# Patient Record
Sex: Male | Born: 1990 | Race: Black or African American | Hispanic: No | Marital: Married | Smoking: Never smoker
Health system: Southern US, Community
[De-identification: ages and names within clinical notes are randomized; demographics above are authoritative.]

## PROBLEM LIST (undated history)

## (undated) DIAGNOSIS — B009 Herpesviral infection, unspecified: Secondary | ICD-10-CM

## (undated) HISTORY — DX: Herpesviral infection, unspecified: B00.9

---

## 2013-12-11 ENCOUNTER — Ambulatory Visit: Payer: Self-pay | Admitting: Adult Health

## 2014-01-01 ENCOUNTER — Ambulatory Visit: Payer: Self-pay | Admitting: Adult Health

## 2014-01-25 ENCOUNTER — Emergency Department: Payer: Self-pay | Admitting: Emergency Medicine

## 2014-01-25 LAB — URINALYSIS, COMPLETE
Bacteria: NONE SEEN
GLUCOSE, UR: NEGATIVE mg/dL (ref 0–75)
Ketone: NEGATIVE
Leukocyte Esterase: NEGATIVE
Nitrite: NEGATIVE
Ph: 8 (ref 4.5–8.0)
Protein: NEGATIVE
RBC,UR: 1 /HPF (ref 0–5)
Specific Gravity: 1.017 (ref 1.003–1.030)
Squamous Epithelial: NONE SEEN

## 2014-01-25 LAB — DRUG SCREEN, URINE
AMPHETAMINES, UR SCREEN: NEGATIVE (ref ?–1000)
Barbiturates, Ur Screen: NEGATIVE (ref ?–200)
Benzodiazepine, Ur Scrn: NEGATIVE (ref ?–200)
Cannabinoid 50 Ng, Ur ~~LOC~~: NEGATIVE (ref ?–50)
Cocaine Metabolite,Ur ~~LOC~~: NEGATIVE (ref ?–300)
MDMA (ECSTASY) UR SCREEN: NEGATIVE (ref ?–500)
Methadone, Ur Screen: NEGATIVE (ref ?–300)
OPIATE, UR SCREEN: NEGATIVE (ref ?–300)
PHENCYCLIDINE (PCP) UR S: NEGATIVE (ref ?–25)
Tricyclic, Ur Screen: NEGATIVE (ref ?–1000)

## 2014-01-25 LAB — COMPREHENSIVE METABOLIC PANEL
ALK PHOS: 92 U/L
AST: 21 U/L (ref 15–37)
Albumin: 4.4 g/dL (ref 3.4–5.0)
Anion Gap: 8 (ref 7–16)
BUN: 15 mg/dL (ref 7–18)
Bilirubin,Total: 0.7 mg/dL (ref 0.2–1.0)
Calcium, Total: 9.7 mg/dL (ref 8.5–10.1)
Chloride: 103 mmol/L (ref 98–107)
Co2: 26 mmol/L (ref 21–32)
Creatinine: 0.97 mg/dL (ref 0.60–1.30)
EGFR (African American): >60
Glucose: 82 mg/dL (ref 65–99)
Osmolality: 274 (ref 275–301)
Potassium: 3.5 mmol/L (ref 3.5–5.1)
SGPT (ALT): 25 U/L (ref 12–78)
Sodium: 137 mmol/L (ref 136–145)
TOTAL PROTEIN: 8.5 g/dL — AB (ref 6.4–8.2)

## 2014-01-25 LAB — ACETAMINOPHEN LEVEL: Acetaminophen: <2 ug/mL

## 2014-01-25 LAB — CBC
HCT: 45.8 % (ref 40.0–52.0)
HGB: 14.9 g/dL (ref 13.0–18.0)
MCH: 27.8 pg (ref 26.0–34.0)
MCHC: 32.4 g/dL (ref 32.0–36.0)
MCV: 86 fL (ref 80–100)
PLATELETS: 166 10*3/uL (ref 150–440)
RBC: 5.34 10*6/uL (ref 4.40–5.90)
RDW: 13.3 % (ref 11.5–14.5)
WBC: 7.1 10*3/uL (ref 3.8–10.6)

## 2014-01-25 LAB — SALICYLATE LEVEL

## 2014-01-25 LAB — ETHANOL: Ethanol %: <0.003 % (ref 0.000–0.080)

## 2014-01-25 LAB — TROPONIN I: Troponin-I: <0.02 ng/mL

## 2014-02-17 ENCOUNTER — Emergency Department (HOSPITAL_BASED_OUTPATIENT_CLINIC_OR_DEPARTMENT_OTHER)
Admission: EM | Admit: 2014-02-17 | Discharge: 2014-02-17 | Discharge status: Against Medical Advice | Payer: BC Managed Care – PPO | Attending: Emergency Medicine | Admitting: Emergency Medicine

## 2014-02-17 ENCOUNTER — Emergency Department (HOSPITAL_BASED_OUTPATIENT_CLINIC_OR_DEPARTMENT_OTHER): Payer: BC Managed Care – PPO

## 2014-02-17 DIAGNOSIS — R0602 Shortness of breath: Secondary | ICD-10-CM | POA: Insufficient documentation

## 2014-02-17 DIAGNOSIS — F411 Generalized anxiety disorder: Secondary | ICD-10-CM | POA: Insufficient documentation

## 2014-02-17 DIAGNOSIS — F419 Anxiety disorder, unspecified: Secondary | ICD-10-CM

## 2014-02-17 DIAGNOSIS — R002 Palpitations: Secondary | ICD-10-CM | POA: Insufficient documentation

## 2014-02-17 MED ORDER — LORAZEPAM 1 MG PO TABS
2.0000 mg | ORAL_TABLET | Freq: Once | ORAL | Status: AC
  Administered 2014-02-17: 2 mg via ORAL
  Filled 2014-02-17: qty 2

## 2014-02-17 NOTE — ED Notes (Signed)
Declines blood test and EKG.  States he was seen at [facility] 1 month ago with same symptoms, had a work up and it was all negative.

## 2014-02-17 NOTE — ED Notes (Signed)
Per EMS:  Pt having personal issues with family and friends.  Pt having anxiety, increased respiratory rate, c/o tingling in arms and legs.  No acute respiratory distress noted.

## 2014-02-17 NOTE — ED Notes (Signed)
Pt on telephone trying to get a ride.

## 2014-02-17 NOTE — ED Provider Notes (Signed)
CSN: [id number]     Arrival date & time 02/17/14  1042 History   First MD Initiated Contact with Patient 02/17/14 1103     Chief Complaint  Patient presents with  . Anxiety     (Consider location/radiation/quality/duration/timing/severity/associated sxs/prior Treatment) Patient is a 23 y.o. male presenting with anxiety. The history is provided by the patient and the EMS personnel.  Anxiety Associated symptoms include chest pain and shortness of breath. Pertinent negatives include no abdominal pain.   patient brought in by EMS from work. Patient stating having anxiety attack. Patient noted increased respiration and tingling in his arms and legs felt as if he had heart palpitations felt short of breath was having substernal chest pain. Patient does state this has happened before but not recently. Patient denies any suicidal or homicidal ideation. Patient doesn't at the does been some family problems but doesn't want to elaborate on what they are. Patient feeling better here but still having chest discomfort and feeling like his palpitations. Cardiac monitor in route showed no significant arrhythmia.  No past medical history on file. No past surgical history on file. No family history on file. History  Substance Use Topics  . Smoking status: Not on file  . Smokeless tobacco: Not on file  . Alcohol Use: Not on file    Review of Systems  Constitutional: Negative for fever.  Eyes: Negative for visual disturbance.  Respiratory: Positive for shortness of breath.   Cardiovascular: Positive for chest pain and palpitations.  Gastrointestinal: Negative for nausea, vomiting and abdominal pain.  Genitourinary: Negative for dysuria.  Musculoskeletal: Negative for back pain.  Skin: Negative for rash.  Psychiatric/Behavioral: Negative for suicidal ideas, confusion and self-injury.      Allergies  Review of patient's allergies indicates no known allergies.  Home Medications   Prior to  Admission medications   Not on File   BP 132/82  Pulse 66  Temp(Src) 98.1 F (36.7 C) (Oral)  Resp 24  Ht 6' (1.829 m)  Wt 170 lb (77.111 kg)  BMI 23.05 kg/m2  SpO2 100% Physical Exam  Nursing note and vitals reviewed. Constitutional: He is oriented to person, place, and time. He appears well-developed and well-nourished. No distress.  HENT:  Head: Normocephalic and atraumatic.  Mouth/Throat: Oropharynx is clear and moist.  Eyes: Conjunctivae and EOM are normal. Pupils are equal, round, and reactive to light.  Neck: Normal range of motion.  Cardiovascular: Normal rate, regular rhythm and normal heart sounds.   No murmur heard. Pulmonary/Chest: [place] normal and breath sounds normal.  Abdominal: Soft. Bowel sounds are normal. There is no tenderness.  Musculoskeletal: Normal range of motion.  Neurological: He is alert and oriented to person, place, and time. No cranial nerve deficit. He exhibits normal muscle tone. Coordination normal.  Skin: Skin is warm.  Psychiatric:  Anxious appearing.    ED Course  Procedures (including critical care time) Labs Review Labs Reviewed  BASIC METABOLIC PANEL  CBC WITH DIFFERENTIAL    Imaging Review No results found.   EKG Interpretation None      MDM   Final diagnoses:  Anxiety    Patient after receiving oral Ativan refuses any additional workup chest x-ray EKG blood work. Patient understands that due to his complaint of substernal chest pain and palpitations at some workup is required either still refuses realizes that could be a significant finding that could be potentially harmful to his health or death. Patient understands and will leave AMA.    Emory Gallentine  Deretha EmoryZackowski, MD 02/17/14 1213

## 2014-02-17 NOTE — ED Notes (Signed)
Dr. Deretha EmoryZackowski at bedside and discussing ordered diagnostics and labs.  Pt still refuses and wants to leave AMA.  EDP discussed risks of leaving AMA.

## 2014-02-17 NOTE — Discharge Instructions (Signed)
Panic Attacks °Panic attacks are sudden, short feelings of great fear or discomfort. You may have them for no reason when you are relaxed, when you are uneasy (anxious), or when you are sleeping.  °HOME CARE °· Take all your medicines as told. °· Check with your doctor before starting new medicines. °· Keep all doctor visits. °GET HELP IF: °· You are not able to take your medicines as told. °· Your symptoms do not get better. °· Your symptoms get worse. °GET HELP RIGHT AWAY IF: °· Your attacks seem different than your normal attacks. °· You have thoughts about hurting yourself or others. °· You take panic attack medicine and you have a side effect. °MAKE SURE YOU: °· Understand these instructions. °· Will watch your condition. °· Will get help right away if you are not doing well or get worse. °Document Released: 08/26/2010 Document Revised: 05/14/2013 Document Reviewed: 03/07/2013 °ExitCare® Patient Information ©2015 [employer]. This information is not intended to replace advice given to you by your health care provider. Make sure you discuss any questions you have with your health care provider. ° ° °Emergency Department Resource Guide °1) Find a Doctor and Pay Out of Pocket °Although you won't have to find out who is covered by your insurance plan, it is a good idea to ask around and get recommendations. You will then need to call the office and see if the doctor you have chosen will accept you as a new patient and what types of options they offer for patients who are self-pay. Some doctors offer discounts or will set up payment plans for their patients who do not have insurance, but you will need to ask so you aren't surprised when you get to your appointment. ° °2) Contact Your Local Health Department °Not all health departments have doctors that can see patients for sick visits, but many do, so it is worth a call to see if yours does. If you don't know where your local health department is, you can check in  your phone book. The CDC also has a tool to help you locate your state's health department, and many state websites also have listings of all of their local health departments. ° °3) Find a Walk-in Clinic °If your illness is not likely to be very severe or complicated, you may want to try a walk in clinic. These are popping up all over the country in pharmacies, drugstores, and shopping centers. They're usually staffed by nurse practitioners or physician assistants that have been trained to treat common illnesses and complaints. They're usually fairly quick and inexpensive. However, if you have serious medical issues or chronic medical problems, these are probably not your best option. ° °No Primary Care Doctor: °- Call Health Connect at  832-8000 - they can help you locate a primary care doctor that  accepts your insurance, provides certain services, etc. °- Physician Referral Service- 1-800-533-3463 ° °Chronic Pain Problems: °Organization         Address  Phone   Notes  °[facility]  (336) 297-2271 Patients need to be referred by their primary care doctor.  ° °Medication Assistance: °Organization         Address  Phone   Notes  °[place] [facility] [address] °[place], [place] [address] (336) 641-8030 --Must be a resident of [place] °-- Must have NO insurance coverage whatsoever (no Medicaid/ Medicare, etc.) °-- The pt. MUST have a primary care doctor   that directs their care regularly and follows them in the community °  °[facility]  (866) 331-1348   °United Way  (888) 892-1162   ° °Agencies that provide inexpensive medical care: °Organization         Address  Phone   Notes  °[facility]  (336) 832-8035   °[facility]    (336) 832-7272   °[facility] [address] °[place], [place] [address] (336) 832-4777   °[facility] [address], °[place] (336) 271-4999    °[facility]    (336) 373-0678   °[facility]    (336) 272-1050   °[facility] ° [address], [place] Phone:  (336) 832-4444, Fax:  (336) 832-4440 Hours of Operation:  9 am - 6 pm, M-F.  Also accepts Medicaid/Medicare and self-pay.  °[facility] ° [address], [place] Phone: (336) 832-3150, Fax: (336) 832-3151. Hours of Operation:  8:30 am - 5:30 pm, M-F.  Also accepts Medicaid and self-pay.  °[facility] [place] [address], [place] Phone: (336) 878-6027   °[facility] [address], [place], [place] (336)723-1848, Ext. 123 Mondays & Thursdays: 7-9 AM.  First 15 patients are seen on a first come, first serve basis. °  ° °Medicaid-accepting [place] Providers: ° °Organization         Address  Phone   Notes  °[facility] [address], [place] (336) 641-2100 Also accepts self-pay patients.  °[facility] [address], [place] ° (336) 856-9996   °[facility] [address], [place] (336) 288-8857   °[facility] [address] [place] [address], [place] (336) 299-7000   °Veita Bland [address], [place]  ° (336) 373-1557 Only accepts [place] Access Medicaid patients after they have their name applied to their card.  ° °Self-Pay (no insurance) in [place]: ° °Organization         Address  Phone   Notes  °Sickle Cell Patients, [facility] [address], [place] (336) 832-1970   °[facility] [address], [place] (336) 832-4400   °[facility] ° [address], [place] (336) 992-4800   °[facility]/Dr. Osei-Bonsu ° [address] [place] [address], [place] or [address], [place] (336) 841-8500 Phone number for both [place] and [place] locations is the  same.  °[facility] [address], [place] (336) 299-0000   °[facility] [address] [place] [address], [place] or [address] (336) 852-7530 °(336) 878-2260   °[facility] [address], [place] (336) 350-1642, phone; (336) 294-5005, fax Sees patients 1st and 3rd Saturday of every month.  Must not qualify for public or private insurance (i.e. Medicaid, Medicare, [place] Health Choice, Veterans' Benefits) • Household income should be no more than 200% of the poverty level •The clinic cannot treat you if you are pregnant or think you are pregnant • Sexually transmitted diseases are not treated at the clinic.  ° ° °Dental Care: °Organization         Address  Phone  Notes  °[place] [facility] [address], [place] (336) 641-6152 Accepts children   up to age 21 who are enrolled in Medicaid or [place] Health Choice; pregnant women with a Medicaid card; and children who have applied for Medicaid or [place] Health Choice, but were declined, whose parents can pay a reduced fee at time of service.  °[facility] [place]  [address], [place] (336) 641-7733 Accepts children up to age 21 who are enrolled in Medicaid or [place] Health Choice; pregnant women with a Medicaid card; and children who have applied for Medicaid or [place] Health Choice, but were declined, whose parents can pay a reduced fee at time of service.  °[facility] ° [address], [place] (336) 641-4533 Patients are seen by appointment only. Walk-ins are not accepted. [facility] will see patients 18 years of age and older. °Monday - Tuesday (8am-5pm) °Most Wednesdays (8:30-5pm) °$30 per visit, cash only  °[facility] ° [address], [place] (336) 641-4533 Patients are seen by appointment only. Walk-ins are not accepted. [facility] will see patients  18 years of age and older. °One Wednesday Evening (Monthly: Volunteer Based).  $30 per visit, cash only  °[facility]  (919) 537-3737 for adults; Children under age 4, call Graduate Pediatric Dentistry at (919) [phone]. Children aged 4-14, please call (919) [phone] to request a pediatric application. ° Dental services are provided in all areas of dental care including fillings, crowns and bridges, complete and partial dentures, implants, gum treatment, root canals, and extractions. Preventive care is also provided. Treatment is provided to both adults and children. °Patients are selected via a lottery and there is often a waiting list. °  °[facility] [address], °[place] ° (336) 763-8833 [email or web address] °  °[facility] [address], [place], [place] (336)723-1848, Ext. 123 Second and Fourth Thursday of each month, opens at 6:30 AM; Clinic ends at 9 AM.  Patients are seen on a first-come first-served basis, and a limited number are seen during each clinic.  ° °[facility] ° [address], [place], [place] (336) 723-7904   Eligibility Requirements °You must have lived in [place], [place], or [place] counties for at least the last three months. °  You cannot be eligible for state or federal sponsored healthcare insurance, including Veterans Administration, Medicaid, or Medicare. °  You generally cannot be eligible for healthcare insurance through your employer.  °  How to apply: °Eligibility screenings are held every Tuesday and Wednesday afternoon from 1:00 pm until 4:00 pm. You do not need an appointment for the interview!  °[facility] [address], [place], [place] 336-631-2330   °[facility]  336-342-8273   °[facility]  336-703-3100   °[facility]  336-570-6415   ° °Behavioral Health Resources in the Community: °Intensive Outpatient  Programs °Organization         Address  Phone  Notes  °[facility] [address], [place], [place] 336-878-6098   °[facility] [address], [place], [place] 336-832-9800   °[facility] [address], [place],  ° 336-882-2125   °[facility] [address],  °[place],  1-800-853-5163 or 336-641-4981   °Substance Abuse Resources °Organization         Address  Phone  Notes  °[facility]  336-882-2125   °Addiction   [facility]  438-084-1214   [facility]  947-242-1690   Floydene Flock  602-119-0424   Residential & Outpatient Substance Abuse Program  (346)689-3845   Psychological Services Organization         Address  Phone  Notes  [facility]  336226-659-9012   [facility]  (682)425-3906   [facility] [address] [place] 940-617-0812 or 330-183-4206    Mobile Crisis Teams Organization         Address  Phone  Notes  [facility], Mobile Crisis Care Unit  (314)581-6737   [facility]  [address] [place], [place] 623-762-8315   Doristine Locks [address] [place] 176-160-7371    Self-Help/Support Groups Organization         Address  Phone             Notes  [facility] [place] - variety of support groups  336- I7437963 Call for more information  Narcotics Anonymous (NA), [facility] [address], [employer] [place]  2 meetings at this location   Statistician         Address  Phone  Notes  [facility] [address],    [place]  0-626-948-5462   [facility]  [address] [place] [address], [place], [place] [phone]   [facility] [address] [place], [place] 169-678-9381 Admissions: 8am-3pm M-F  [facility] [address],    [place], [place]  017-510-2585   [facility] [address] [place], [place], [place] 277-824-2353   [facility] [address],  [place], [place] 614-431-5400   [facility] Intensive Outpatient [address], [place], [place] 867-619-5093   [facility] ([facility] [address] [place]  [place], [place] 2-671-245-8099 or 954-550-8630   [facility] [address] [place], [place] 767-341-9379 Accepts Medicaid  [facility] [address]  [place] 0-240-973-5329 Substance Abuse/Addiction Treatment   [facility] Organization         Address  Phone  Notes  [facility]  862-471-0794   Angie Fava, PhD [address] [place], [place]   (336)334-0025 or 209-073-2439   [facility]   [address] [place], [place] 651-628-0426   [facility] [address] [place], [place] 413-312-0984 Insurance/Medicaid/sponsorship through [facility] [address]                                    [place], [place] [phone] Therapy/tele-psych/case  [facility] [address] [place], [place] (814)[phone]    Dr. Lolly Mustache  4046870614   [facility]  [employer] [facility]. 1) [address] [place] 2) [address] [place] 3)  [address], [place] [phone]  [phone]   [facility] Child Abuse Hotline 407-655-2602 or 262 377 1704 (After Hours)      Patient insisting on leaving AMA. The patient does not want to stay for minimal workup for the chest discomfort and palpitation sensation in his chest. Patient understands potential risk we have ruled out arrhythmia we have that ruled out  any acute lung problems like pneumothorax. Patient understands and a cyst on leaving. Patient will sign out I AMA and knows that he can return at any time.

## 2015-07-28 ENCOUNTER — Ambulatory Visit (INDEPENDENT_AMBULATORY_CARE_PROVIDER_SITE_OTHER): Payer: BLUE CROSS/BLUE SHIELD | Admitting: Physician Assistant

## 2015-07-28 ENCOUNTER — Encounter: Payer: Self-pay | Admitting: Physician Assistant

## 2015-07-28 VITALS — BP 100/80 | HR 66 | Temp 98.7°F | Resp 16 | Ht 72.0 in | Wt 199.4 lb

## 2015-07-28 DIAGNOSIS — B009 Herpesviral infection, unspecified: Secondary | ICD-10-CM

## 2015-07-28 DIAGNOSIS — F329 Major depressive disorder, single episode, unspecified: Secondary | ICD-10-CM

## 2015-07-28 DIAGNOSIS — F418 Other specified anxiety disorders: Secondary | ICD-10-CM | POA: Insufficient documentation

## 2015-07-28 DIAGNOSIS — F32A Depression, unspecified: Secondary | ICD-10-CM

## 2015-07-28 MED ORDER — CITALOPRAM HYDROBROMIDE 10 MG PO TABS: 10.0000 mg | ORAL_TABLET | Freq: Every day | ORAL | Status: DC

## 2015-07-28 MED ORDER — VALACYCLOVIR HCL 1 G PO TABS: 1000.0000 mg | ORAL_TABLET | Freq: Every day | ORAL | Status: DC

## 2015-07-28 NOTE — Patient Instructions (Signed)
Major Depressive Disorder Major depressive disorder is a mental illness. It also may be called clinical depression or unipolar depression. Major depressive disorder usually causes feelings of sadness, hopelessness, or helplessness. Some people with this disorder do not feel particularly sad but lose interest in doing things they used to enjoy (anhedonia). Major depressive disorder also can cause physical symptoms. It can interfere with work, school, relationships, and other normal everyday activities. The disorder varies in severity but is longer lasting and more serious than the sadness we all feel from time to time in our lives. Major depressive disorder often is triggered by stressful life events or major life changes. Examples of these triggers include divorce, loss of your job or home, a move, and the death of a family member or close friend. Sometimes this disorder occurs for no obvious reason at all. People who have family members with major depressive disorder or bipolar disorder are at higher risk for developing this disorder, with or without life stressors. Major depressive disorder can occur at any age. It may occur just once in your life (single episode major depressive disorder). It may occur multiple times (recurrent major depressive disorder). SYMPTOMS People with major depressive disorder have either anhedonia or depressed mood on nearly a daily basis for at least 2 weeks or longer. Symptoms of depressed mood include:  Feelings of sadness (blue or down in the dumps) or emptiness.  Feelings of hopelessness or helplessness.  Tearfulness or episodes of crying (may be observed by others).  Irritability (children and adolescents). In addition to depressed mood or anhedonia or both, people with this disorder have at least four of the following symptoms:  Difficulty sleeping or sleeping too much.   Significant change (increase or decrease) in appetite or weight.   Lack of energy or  motivation.  Feelings of guilt and worthlessness.   Difficulty concentrating, remembering, or making decisions.  Unusually slow movement (psychomotor retardation) or restlessness (as observed by others).   Recurrent wishes for death, recurrent thoughts of self-harm (suicide), or a suicide attempt. People with major depressive disorder commonly have persistent negative thoughts about themselves, other people, and the world. People with severe major depressive disorder may experiencedistorted beliefs or perceptions about the world (psychotic delusions). They also may see or hear things that are not real (psychotic hallucinations). DIAGNOSIS Major depressive disorder is diagnosed through an assessment by your health care provider. Your health care provider will ask aboutaspects of your daily life, such as mood,sleep, and appetite, to see if you have the diagnostic symptoms of major depressive disorder. Your health care provider may ask about your medical history and use of alcohol or drugs, including prescription medicines. Your health care provider also may do a physical exam and blood work. This is because certain medical conditions and the use of certain substances can cause major depressive disorder-like symptoms (secondary depression). Your health care provider also may refer you to a mental health specialist for further evaluation and treatment. TREATMENT It is important to recognize the symptoms of major depressive disorder and seek treatment. The following treatments can be prescribed for this disorder:   Medicine. Antidepressant medicines usually are prescribed. Antidepressant medicines are thought to correct chemical imbalances in the brain that are commonly associated with major depressive disorder. Other types of medicine may be added if the symptoms do not respond to antidepressant medicines alone or if psychotic delusions or hallucinations occur.  Talk therapy. Talk therapy can be  helpful in treating major depressive disorder by providing   support, education, and guidance. Certain types of talk therapy also can help with negative thinking (cognitive behavioral therapy) and with relationship issues that trigger this disorder (interpersonal therapy). A mental health specialist can help determine which treatment is best for you. Most people with major depressive disorder do well with a combination of medicine and talk therapy. Treatments involving electrical stimulation of the brain can be used in situations with extremely severe symptoms or when medicine and talk therapy do not work over time. These treatments include electroconvulsive therapy, transcranial magnetic stimulation, and vagal nerve stimulation.   This information is not intended to replace advice given to you by your health care provider. Make sure you discuss any questions you have with your health care provider.   Document Released: 11/18/2012 Document Revised: 08/14/2014 Document Reviewed: 11/18/2012 Elsevier Interactive Patient Education 2016 Elsevier Inc.  Citalopram tablets What is this medicine? CITALOPRAM (sye TAL oh pram) is a medicine for depression. This medicine may be used for other purposes; ask your health care provider or pharmacist if you have questions. What should I tell my health care provider before I take this medicine? They need to know if you have any of these conditions: -bipolar disorder or a family history of bipolar disorder -diabetes -glaucoma -heart disease -history of irregular heartbeat -kidney or liver disease -low levels of magnesium or potassium in the blood -receiving electroconvulsive therapy -seizures (convulsions) -suicidal thoughts or a previous suicide attempt -an unusual or allergic reaction to citalopram, escitalopram, other medicines, foods, dyes, or preservatives -pregnant or trying to become pregnant -breast-feeding How should I use this medicine? Take this  medicine by mouth with a glass of water. Follow the directions on the prescription label. You can take it with or without food. Take your medicine at regular intervals. Do not take your medicine more often than directed. Do not stop taking this medicine suddenly except upon the advice of your doctor. Stopping this medicine too quickly may cause serious side effects or your condition may worsen. A special MedGuide will be given to you by the pharmacist with each prescription and refill. Be sure to read this information carefully each time. Talk to your pediatrician regarding the use of this medicine in children. Special care may be needed. Patients over 25 years old may have a stronger reaction and need a smaller dose. Overdosage: If you think you have taken too much of this medicine contact a poison control center or emergency room at once. NOTE: This medicine is only for you. Do not share this medicine with others. What if I miss a dose? If you miss a dose, take it as soon as you can. If it is almost time for your next dose, take only that dose. Do not take double or extra doses. What may interact with this medicine? Do not take this medicine with any of the following medications: -certain medicines for fungal infections like fluconazole, itraconazole, ketoconazole, posaconazole, voriconazole -cisapride -dofetilide -dronedarone -escitalopram -linezolid -MAOIs like Carbex, Eldepryl, Marplan, Nardil, and Parnate -methylene blue (injected into a vein) -pimozide -thioridazine -ziprasidone This medicine may also interact with the following medications: -alcohol -aspirin and aspirin-like medicines -carbamazepine -certain medicines for depression, anxiety, or psychotic disturbances -certain medicines for infections like chloroquine, clarithromycin, erythromycin, furazolidone, isoniazid, pentamidine -certain medicines for migraine headaches like almotriptan, eletriptan, frovatriptan, naratriptan,  rizatriptan, sumatriptan, zolmitriptan -certain medicines for sleep -certain medicines that treat or prevent blood clots like dalteparin, enoxaparin, warfarin -cimetidine -diuretics -fentanyl -lithium -methadone -metoprolol -NSAIDs, medicines for pain and  inflammation, like ibuprofen or naproxen -omeprazole -other medicines that prolong the QT interval (cause an abnormal heart rhythm) -procarbazine -rasagiline -supplements like St. John's wort, kava kava, valerian -tramadol -tryptophan This list may not describe all possible interactions. Give your health care provider a list of all the medicines, herbs, non-prescription drugs, or dietary supplements you use. Also tell them if you smoke, drink alcohol, or use illegal drugs. Some items may interact with your medicine. What should I watch for while using this medicine? Tell your doctor if your symptoms do not get better or if they get worse. Visit your doctor or health care professional for regular checks on your progress. Because it may take several weeks to see the full effects of this medicine, it is important to continue your treatment as prescribed by your doctor. Patients and their families should watch out for new or worsening thoughts of suicide or depression. Also watch out for sudden changes in feelings such as feeling anxious, agitated, panicky, irritable, hostile, aggressive, impulsive, severely restless, overly excited and hyperactive, or not being able to sleep. If this happens, especially at the beginning of treatment or after a change in dose, call your health care professional. Bonita Quin may get drowsy or dizzy. Do not drive, use machinery, or do anything that needs mental alertness until you know how this medicine affects you. Do not stand or sit up quickly, especially if you are an older patient. This reduces the risk of dizzy or fainting spells. Alcohol may interfere with the effect of this medicine. Avoid alcoholic drinks. Your  mouth may get dry. Chewing sugarless gum or sucking hard candy, and drinking plenty of water will help. Contact your doctor if the problem does not go away or is severe. What side effects may I notice from receiving this medicine? Side effects that you should report to your doctor or health care professional as soon as possible: -allergic reactions like skin rash, itching or hives, swelling of the face, lips, or tongue -chest pain -confusion -dizziness -fast, irregular heartbeat -fast talking and excited feelings or actions that are out of control -feeling faint or lightheaded, falls -hallucination, loss of contact with reality -seizures -shortness of breath -suicidal thoughts or other mood changes -unusual bleeding or bruising Side effects that usually do not require medical attention (report to your doctor or health care professional if they continue or are bothersome): -blurred vision -change in appetite -change in sex drive or performance -headache -increased sweating -nausea -trouble sleeping This list may not describe all possible side effects. Call your doctor for medical advice about side effects. You may report side effects to FDA at 1-800-FDA-1088. Where should I keep my medicine? Keep out of reach of children. Store at room temperature between 15 and 30 degrees C (59 and 86 degrees F). Throw away any unused medicine after the expiration date. NOTE: This sheet is a summary. It may not cover all possible information. If you have questions about this medicine, talk to your doctor, pharmacist, or health care provider.    2016, Elsevier/Gold Standard. (2013-02-14 13:19:48)

## 2015-07-28 NOTE — Progress Notes (Signed)
Patient: Jason LawsDeonta A Tutson, Male    DOB: 12/31/1990, 24 y.o.   MRN: [id number] Visit Date: 07/28/2015  Today's Provider: Margaretann LovelessJennifer M Burnette, PA-C   Chief Complaint  Patient presents with  . Establish Care  . Depression  . Exposure to STD   Subjective:    Establish Care:  Jason Peterson is a 24 y.o. male who presents today to establish care and voice concerns about being depressed.  He feels well. He reports not exercising. He reports he is sleeping well.   Depression: Patient complains of depression. He complains of hopelessness, depressed mood, anxious and chest pain, patient states it comes with the depressed mood . 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  He denies current suicidal and homicidal plan or intent.   Family history significant for no psychiatric illness.Possible organic causes contributing are: none.  Risk factors: none No previous treatment. Per patient wants more STD testing done. Per patient wife was diagnosed with herpes and is currently on medication.  Sexually Transmitted Disease Check: Patient presents for sexually transmitted disease check. Sexual history reviewed with the patient. STD exposure: sexual contact 1  with individual thought to have HSV (wife).  Previous history of STD:  HPV. Current symptoms include sometimes has a rash specially when patient doesn't take the medication.  Contraception: none.  He states that at the time of diagnosis of HSV him and his wife have been separated just prior. They have both went through counseling and are back together now and he does state that their relationship is in a good place at this time. He states that he was diagnosed with HPV approximately one year ago but has been asymptomatic. Following the separation in June him and his wife both had an outbreak of HSV and both were treated with Valtrex. They both responded well to Valtrex. He states that he has continued to  take Valtrex as he gets recurrent outbreaks when he is not on the medication. [facility] There is currently no outbreak.  He and his wife do have 3 children: 2 girls, aged 894 and 1, and 1 boy, age 64. He states the children are healthy and he feels his family life is a good spot for him right now. He is also taking online classes through [employer] online. He states he is in between jobs right now and feels that this plus school as well as stress with pain pills is what lead him to have increase irritability and depression symptoms.  Review of Systems  Constitutional: Positive for appetite change (not eating much due to depression). Negative for fever, chills, diaphoresis, fatigue and unexpected weight change.  HENT: Negative.   Eyes: Negative.   Respiratory: Positive for chest tightness. Negative for cough, shortness of breath and wheezing.   Cardiovascular: Positive for chest pain. Negative for palpitations and leg swelling.  Gastrointestinal: Negative.   Endocrine: Negative.   Genitourinary: Negative.   Musculoskeletal: Negative.   Skin: Negative.   Allergic/Immunologic: Negative.   Neurological: Negative.   Hematological: Negative.   Psychiatric/Behavioral: Positive for dysphoric mood and agitation. Negative for suicidal ideas, sleep disturbance and self-injury. The patient is nervous/anxious.     Social History      He  reports that he has never smoked. He has never used smokeless tobacco. He reports that he does not drink alcohol or use illicit drugs.  Social History   Social History  . Marital Status: Married    Spouse Name: N/A  . Number of Children: N/A  . Years of Education: N/A   Social History Main Topics  . Smoking status: Never Smoker   . Smokeless tobacco: Never Used  . Alcohol Use: No  . Drug Use: No  . Sexual Activity: Not Asked   Other Topics Concern  . None   Social History Narrative  . None     Patient Active Problem List   Diagnosis Date Noted  . Depression 07/28/2015    History reviewed. No pertinent past surgical history.  Family History        Family Status  Relation Status Death Age  . Mother Alive   . Father Alive         His family history is not on file.    No Known Allergies  Previous Medications   VALACYCLOVIR HCL (VALTREX PO)    Take by mouth daily.    Patient Care Team: Margaretann Loveless, PA-C as PCP - General (Family Medicine) No Pcp Per Patient (General Practice)     Objective:   Vitals: BP 100/80 mmHg  Pulse 66  Temp(Src) 98.7 F (37.1 C) (Oral)  Resp 16  Ht 6' (1.829 m)  Wt 199 lb 6.4 oz (90.447 kg)  BMI 27.04 kg/m2   Physical Exam  Constitutional: He appears well-developed and well-nourished. No distress.  HENT:  Head: Normocephalic and atraumatic.  Right Ear: Tympanic membrane, external ear and ear canal normal.  Left Ear: Tympanic membrane, external ear and ear canal normal.  Nose: Nose normal.  Mouth/Throat: Uvula is midline, oropharynx is clear and moist and mucous membranes are normal. No oropharyngeal exudate.  Eyes: Conjunctivae and EOM are normal. Pupils are equal, round, and reactive to light. Right eye exhibits no discharge. Left eye exhibits no discharge.  Neck: Normal range of motion. Neck supple. No JVD present. No tracheal deviation present. No thyromegaly present.  Cardiovascular: Normal rate, regular rhythm and normal heart sounds.  Exam reveals no gallop and no friction rub.   No murmur heard. Pulmonary/Chest: Effort normal and breath sounds normal. No respiratory distress. He has no wheezes. He has no rales.  Lymphadenopathy:    He has no cervical adenopathy.  Skin: He is not diaphoretic.  Psychiatric: He has a normal mood and affect. His behavior is normal. Judgment and thought content normal.  Vitals reviewed.    Depression Screen PHQ 2/9 Scores 07/28/2015  PHQ - 2 Score 2  PHQ- 9 Score 11       Assessment & Plan:     Routine Health Maintenance and Physical Exam  1. Depression We discussed depression and anxiety in detail. We discussed different medications that could be used and adverse reactions and benefits of each. We decided that we will start with citalopram 10 mg as it does have a good response with depression and anxiety. I did advise him to call the office if he has any worsening symptoms or adverse reactions with the medication. I also advised him that we will start with 1 tablet by mouth at night before bedtime. He may try this dose for one to 2 weeks. If he is tolerating the medication well but feels that he needs a higher dose he may increase to taking 2 tablets by mouth at night prior to bedtime. I will follow-up with him in 4 weeks to see how he is doing. He is to call the office  in the meantime if he has any acute issues, questions or concerns. - citalopram (CELEXA) 10 MG tablet; Take 1 tablet (10 mg total) by mouth daily.  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 I will refill his Valtrex as below so that he may continue suppressive treatment as he seems to have frequent recurrent outbreak type of HSV. He does state that after our discussion of STDs and they're signs and symptoms he does not feel that he needs to have any further testing done. He is to call the office if he has any acute outbreak, acute issue, questions or concerns. - valACYclovir (VALTREX) 1000 MG tablet; Take 1 tablet (1,000 mg total) by mouth daily.  Dispense: 30 tablet; Refill: 6   Exercise Activities and Dietary recommendations Goals    None       There is no immunization history on file for this patient.  Health Maintenance  Topic Date Due  . HIV Screening  12/06/2004  . TETANUS/TDAP  12/06/2008  . INFLUENZA VACCINE  03/08/2015      Discussed health benefits of physical activity, and  encouraged him to engage in regular exercise appropriate for his age and condition.    --------------------------------------------------------------------

## 2016-05-28 IMAGING — CR DG CHEST 2V
1 series · 2 of 2 positions shown · non-contrast
Comparison: None.

CLINICAL DATA: Left-sided chest pain

EXAM:
CHEST  2 VIEW

[Series 1: w chest pa · 0.14mm/px · 2 of 2 slices shown]
[im 1/2]
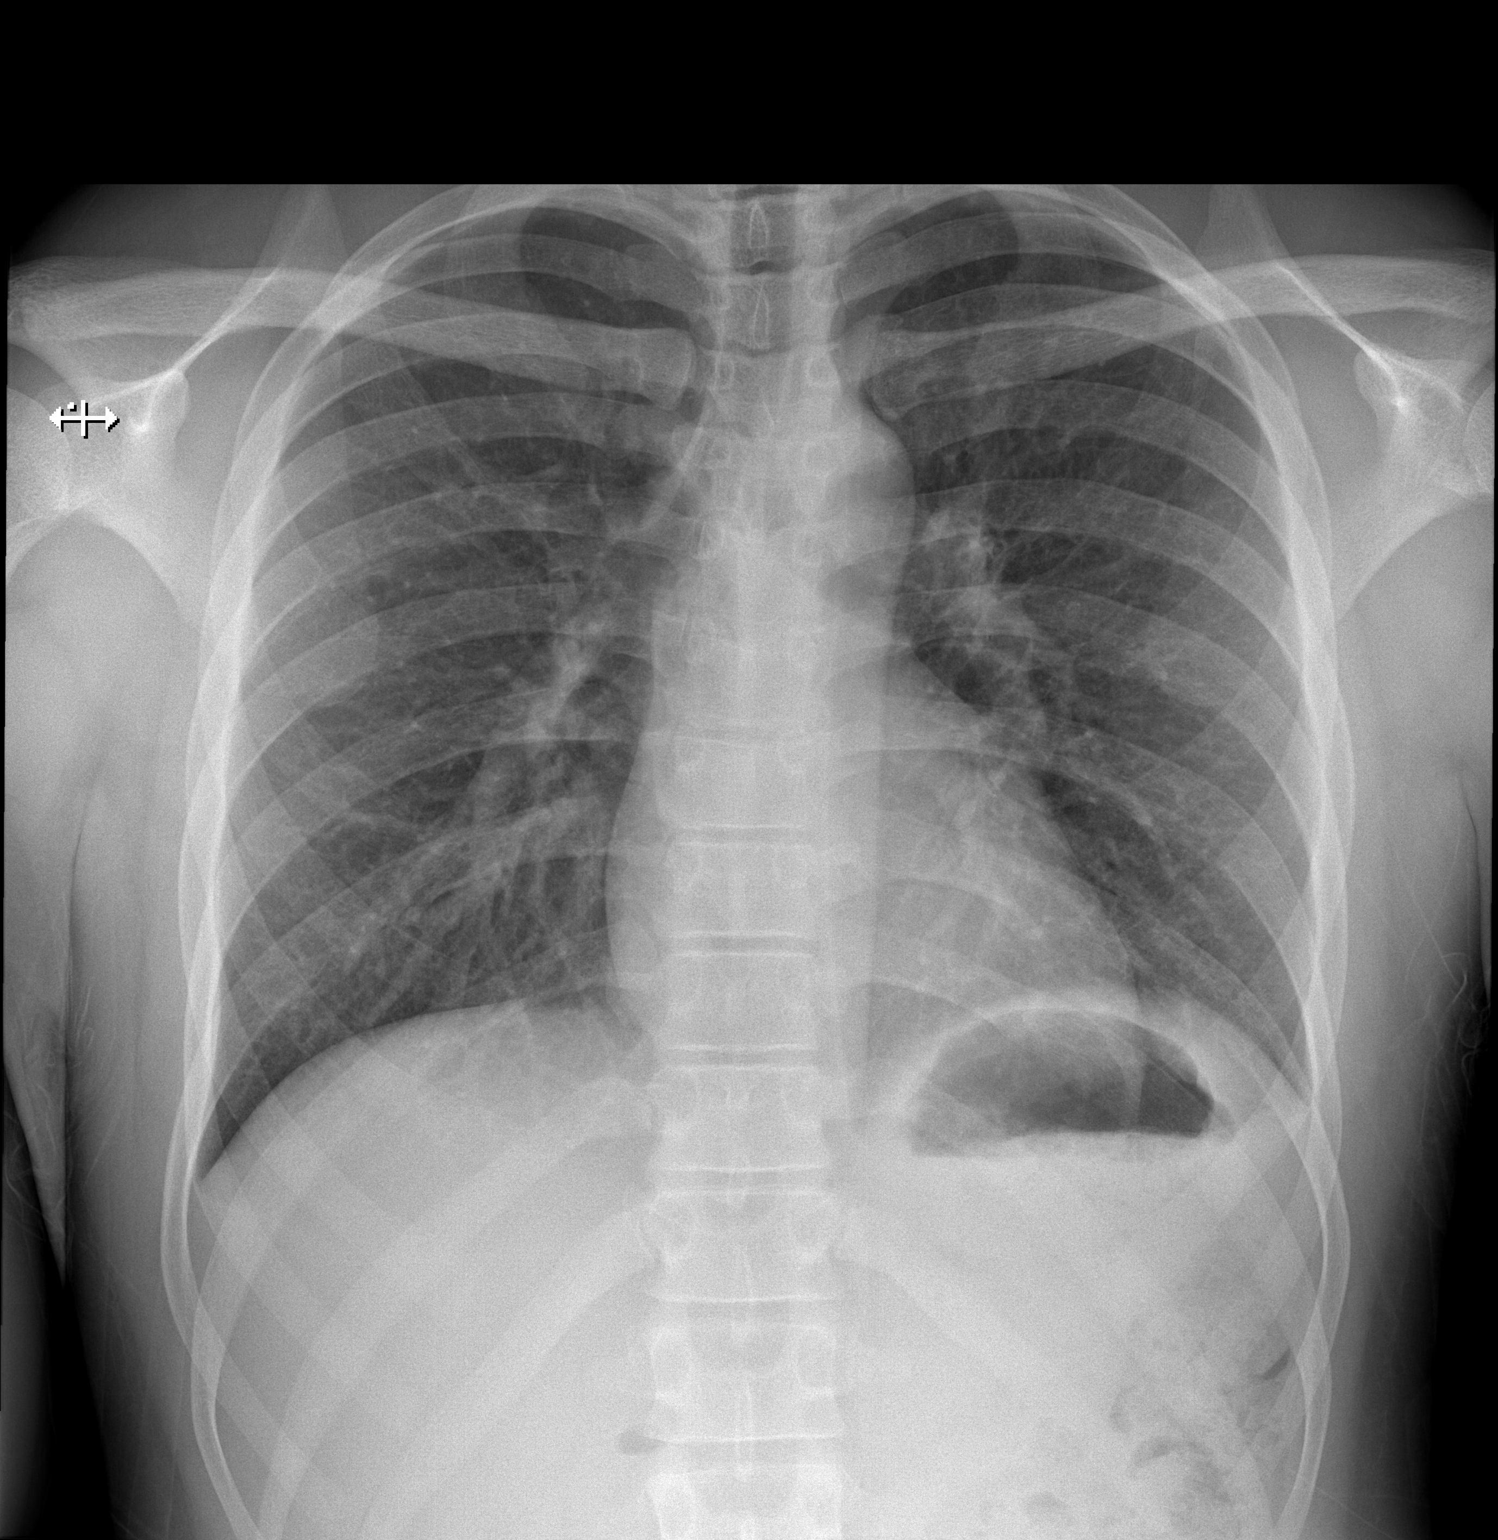
[im 2/2]
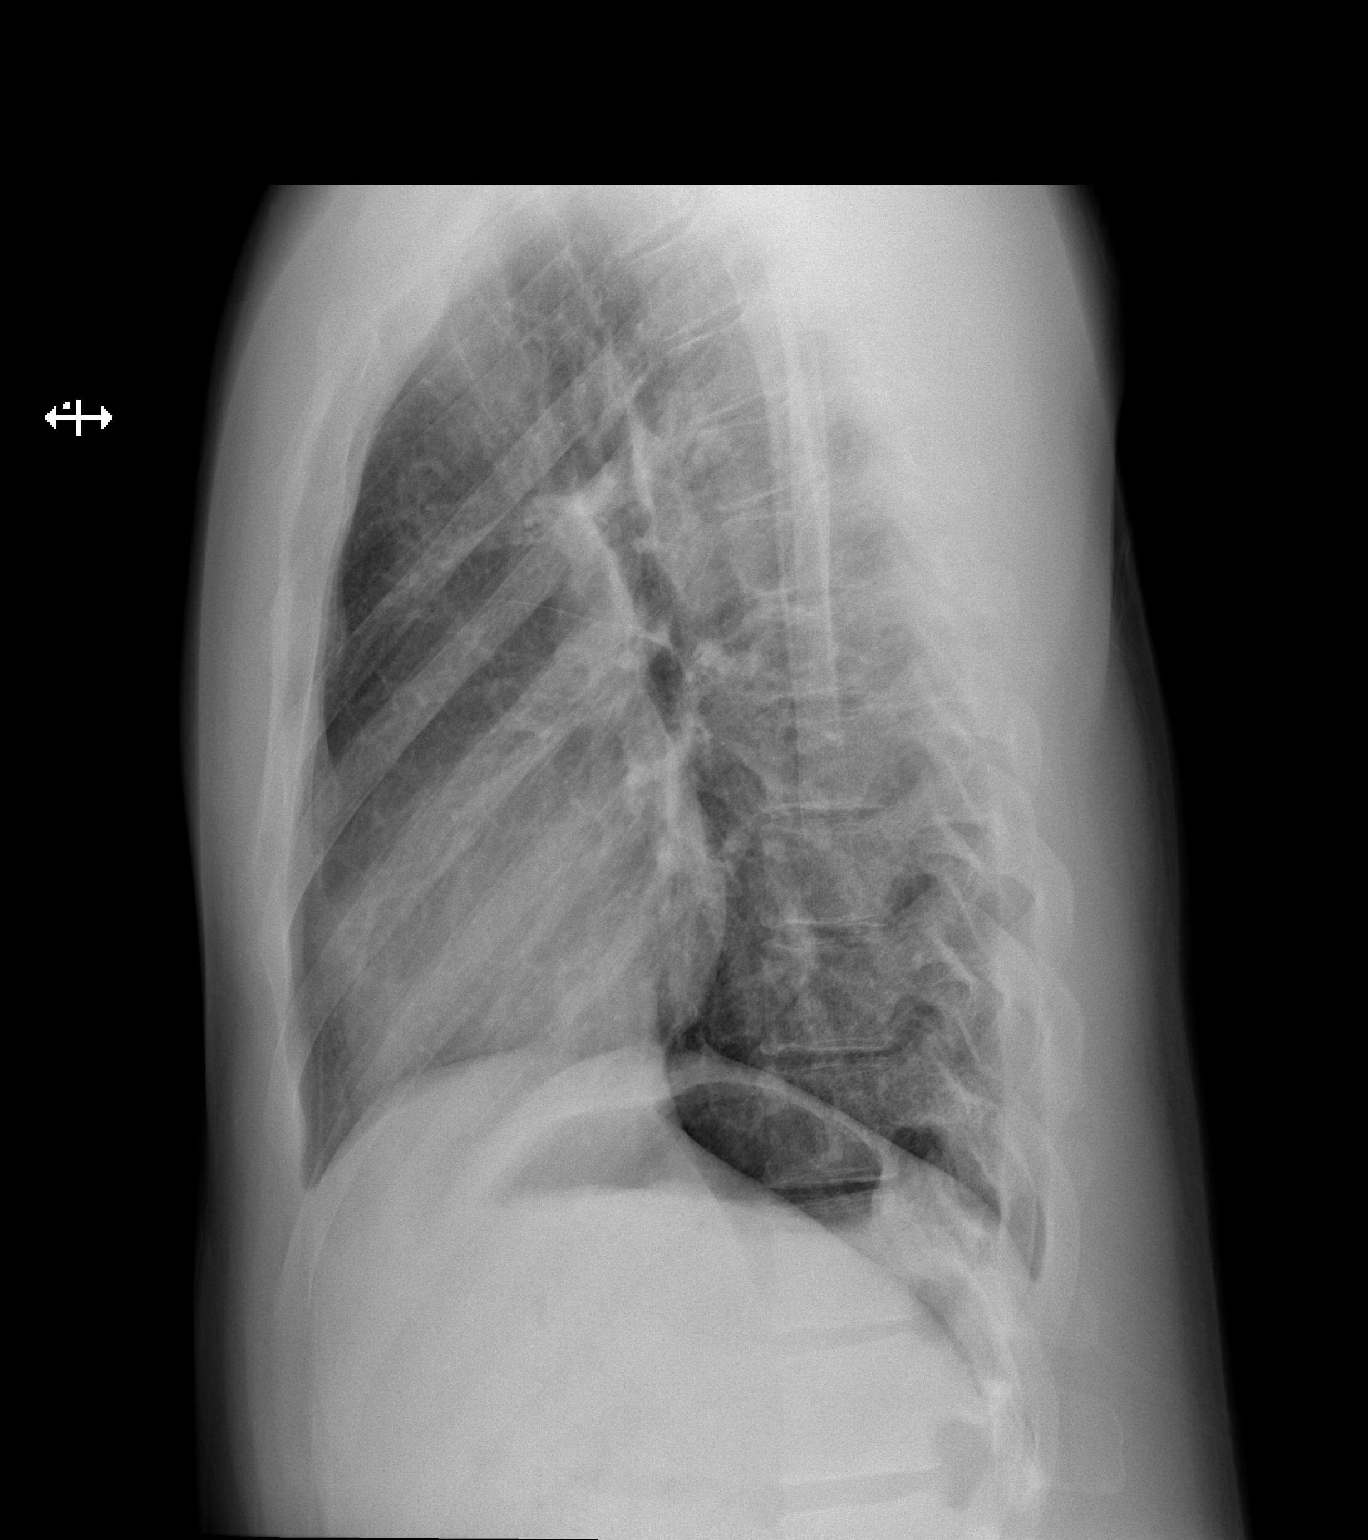

[2 of 2 positions shown; findings below may reference images not displayed]

FINDINGS: The heart size and mediastinal contours are within normal limits.
Both lungs are clear. The visualized skeletal structures are
unremarkable.
IMPRESSION: No active cardiopulmonary disease.

## 2021-03-04 ENCOUNTER — Ambulatory Visit: Payer: BLUE CROSS/BLUE SHIELD | Admitting: Family Medicine

## 2021-04-22 NOTE — Progress Notes (Deleted)
   There were no vitals taken for this visit.   Subjective:    Patient ID: Jason Peterson, male    DOB: 06/20/1991, 30 y.o.   MRN: [id number]  HPI: Jason Peterson is a 30 y.o. male  No chief complaint on file.  Patient presents to clinic to establish care with new PCP.  Patient reports a history of ***. Patient denies a history of: Hypertension, Elevated Cholesterol, Diabetes, Thyroid problems, Depression, Anxiety, Neurological problems, and Abdominal problems.  Active Ambulatory Problems    Diagnosis Date Noted   Depression 07/28/2015   Resolved Ambulatory Problems    Diagnosis Date Noted   No Resolved Ambulatory Problems   No Additional Past Medical History   No past surgical history on file.  No family history on file.   Review of Systems  Per HPI unless specifically indicated above     Objective:    There were no vitals taken for this visit.  Wt Readings from Last 3 Encounters:  07/28/15 199 lb 6.4 oz (90.4 kg)  02/17/14 170 lb (77.1 kg)    Physical Exam  No results found for this or any previous visit.    Assessment & Plan:   Problem List Items Addressed This Visit       Other   Depression   Other Visit Diagnoses     Encounter to establish care    -  Primary        Follow up plan: No follow-ups on file.

## 2021-04-25 ENCOUNTER — Ambulatory Visit: Payer: BLUE CROSS/BLUE SHIELD | Admitting: Nurse Practitioner

## 2021-04-25 DIAGNOSIS — F32A Depression, unspecified: Secondary | ICD-10-CM

## 2021-04-25 DIAGNOSIS — Z7689 Persons encountering health services in other specified circumstances: Secondary | ICD-10-CM

## 2021-09-23 ENCOUNTER — Encounter: Payer: Self-pay | Admitting: Family Medicine

## 2021-09-23 ENCOUNTER — Other Ambulatory Visit: Payer: Self-pay

## 2021-09-23 ENCOUNTER — Ambulatory Visit: Payer: Medicaid Other | Admitting: Family Medicine

## 2021-09-23 VITALS — BP 112/64 | HR 69 | Ht 71.5 in | Wt 234.0 lb

## 2021-09-23 DIAGNOSIS — Z9989 Dependence on other enabling machines and devices: Secondary | ICD-10-CM

## 2021-09-23 DIAGNOSIS — G4733 Obstructive sleep apnea (adult) (pediatric): Secondary | ICD-10-CM | POA: Insufficient documentation

## 2021-09-23 DIAGNOSIS — F418 Other specified anxiety disorders: Secondary | ICD-10-CM | POA: Diagnosis not present

## 2021-09-23 MED ORDER — VENLAFAXINE HCL ER 75 MG PO CP24: 75.0000 mg | ORAL_CAPSULE | Freq: Every day | ORAL | 0 refills | Status: DC

## 2021-09-23 NOTE — Progress Notes (Signed)
°  ° °  Primary Care / Sports Medicine Office Visit  Patient Information:  Patient ID: Jason Peterson, male DOB: January 21, 1991 Age: 31 y.o. MRN: [id number]   [name] is a pleasant 31 y.o. 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  No results found.   Independent interpretation of notes and tests performed by another provider:   None  Procedures performed:   None  Pertinent History, Exam, Impression, and Recommendations:   OSA on CPAP Patient diagnosed with OSA and was prescribed CPAP while in [place], he states moderate compliance with this citing issues with the nasal mask.  He now lives in the area and is interested in establishing care, a referral was placed for ENT/sleep medicine for further evaluation and management.  We will follow peripherally on this issue.  Depression with anxiety Patient reports longstanding history of depression/anxiety, initially noted years prior when he was first married, was advised Rx management but never filled this.  Over the past few months he has noted progressively worsening depression/anxiety which he attributes to familial stressors, financial, and life in general.  His primary symptoms include low energy/fatigue and mental fog.  He denies any ideations.  We reviewed various pharmacologic and nonpharmacologic treatment strategies, he is amenable to seeing psychiatry for cognitive behavioral therapy, additionally will initiate Effexor 75 mg daily and have him return in 6 weeks for reevaluation.  We can determine the need for titration versus transition of medication.  Chronic condition, symptomatic, Rx  management   Orders & Medications Meds ordered this encounter  Medications   venlafaxine XR (EFFEXOR XR) 75 MG 24 hr capsule    Sig: Take 1 capsule (75 mg total) by mouth daily with breakfast.    Dispense:  45 capsule    Refill:  0   Orders Placed This Encounter  Procedures   Ambulatory referral to ENT   Ambulatory referral to Psychiatry     Return in about 6 weeks (around 11/04/2021) for Annual physical.     Montel Culver, MD   [place]

## 2021-09-23 NOTE — Patient Instructions (Signed)
-   Start Effexor daily - Referral coordinator will contact you in regards to following up with ENT/sleep medicine and psychiatry - Contact us for any questions - Return for follow-up in 6 weeks

## 2021-09-23 NOTE — Assessment & Plan Note (Signed)
Patient diagnosed with OSA and was prescribed CPAP while in [place], he states moderate compliance with this citing issues with the nasal mask.  He now lives in the area and is interested in establishing care, a referral was placed for ENT/sleep medicine for further evaluation and management.  We will follow peripherally on this issue.

## 2021-09-23 NOTE — Assessment & Plan Note (Signed)
Patient reports longstanding history of depression/anxiety, initially noted years prior when he was first married, was advised Rx management but never filled this.  Over the past few months he has noted progressively worsening depression/anxiety which he attributes to familial stressors, financial, and life in general.  His primary symptoms include low energy/fatigue and mental fog.  He denies any ideations.  We reviewed various pharmacologic and nonpharmacologic treatment strategies, he is amenable to seeing psychiatry for cognitive behavioral therapy, additionally will initiate Effexor 75 mg daily and have him return in 6 weeks for reevaluation.  We can determine the need for titration versus transition of medication.  Chronic condition, symptomatic, Rx management

## 2021-09-29 ENCOUNTER — Other Ambulatory Visit: Payer: Self-pay | Admitting: Family Medicine

## 2021-09-29 DIAGNOSIS — B009 Herpesviral infection, unspecified: Secondary | ICD-10-CM

## 2021-09-29 DIAGNOSIS — F418 Other specified anxiety disorders: Secondary | ICD-10-CM

## 2021-09-29 NOTE — Telephone Encounter (Signed)
Requested medication (s) are due for refill today: valtrex - expired, effexor signed 09/23/21  Requested medication (s) are on the active medication list: yes  Last refill:  valtrex- 07/28/15 #30 0 refill, effexor - 09/23/21 #45 0 refills  Future visit scheduled: yes 1 month  Notes to clinic:  expired medication. Valtrex. Do you want to renew Rx? Receipt confirmed by pharmacy 09/23/21 at 4:04 pm for effexor. Do you want to reorder ?     Requested Prescriptions  Pending Prescriptions Disp Refills   valACYclovir (VALTREX) 1000 MG tablet 30 tablet 6    Sig: Take 1 tablet (1,000 mg total) by mouth daily.     Antimicrobials:  Antiviral Agents - Anti-Herpetic Passed - 09/29/2021  4:05 PM      Passed - Valid encounter within last 12 months    Recent Outpatient Visits           6 days ago Depression with anxiety   [facility] Jerrol Banana, MD   6 years ago Depression   [facility] [place], [place]andra [place]vel[place], [place]       Future Appointments             In 1 month Ashley Royalty, Ocie Bob, MD [facility]             venlafaxine XR (EFFEXOR XR) 75 MG 24 hr capsule 45 capsule 0    Sig: Take 1 capsule (75 mg total) by mouth daily with breakfast.     Psychiatry: Antidepressants - SNRI - desvenlafaxine & venlafaxine Failed - 09/29/2021  4:05 PM      Failed - Cr in normal range and within 360 days    Creatinine  Date Value Ref Range Status  01/25/2014 0.97 0.60 - 1.30 mg/dL Final          Failed - Lipid Panel in normal range within the last 12 months    No results found for: CHOL, POCCHOL, CHOLTOT No results found for: LDLCALC, LDLC, HIRISKLDL, POCLDL, LDLDIRECT, REALLDLC, TOTLDLC No results found for: HDL, POCHDL No results found for: TRIG, POCTRIG       Passed - Completed PHQ-2 or PHQ-9 in the last 360 days      Passed - Last BP in normal range    BP Readings from Last 1 Encounters:  09/23/21 112/64          Passed - Valid encounter  within last 6 months    Recent Outpatient Visits           6 days ago Depression with anxiety   [facility] Jerrol Banana, MD   6 years ago Depression   [facility] [place], [place], [place]       Future Appointments             In 1 month Ashley Royalty, Ocie Bob, MD [facility], [facility]

## 2021-09-29 NOTE — Telephone Encounter (Signed)
Medication Refill - Medication:valACYclovir (VALTREX) 1000 MG tablet , also sys he never received and wasn't at pharmacy, the ,venlafaxine XR (EFFEXOR XR) 75 MG 24 hr capsule , that was prescribed 2/17   Has the patient contacted their pharmacy? yes (Agent: If no, request that the patient contact the pharmacy for the refill. If patient does not wish to contact the pharmacy document the reason why and proceed with request.) (Agent: If yes, when and what did the pharmacy advise?)contact pcp  Preferred Pharmacy (with phone number or street name):  [id number] [place] Rough, [place] - [address] AT [facility] [address] Phone:  [phone]  Fax:  [phone]     Has the patient been seen for an appointment in the last year OR does the patient have an upcoming appointment? yes  Agent: Please be advised that RX refills may take up to 3 business days. We ask that you follow-up with your pharmacy.

## 2021-09-30 MED ORDER — VALACYCLOVIR HCL 1 G PO TABS: 1000.0000 mg | ORAL_TABLET | Freq: Every day | ORAL | 6 refills | Status: AC

## 2021-09-30 MED ORDER — VENLAFAXINE HCL ER 75 MG PO CP24: 75.0000 mg | ORAL_CAPSULE | Freq: Every day | ORAL | 0 refills | Status: DC

## 2021-09-30 NOTE — Telephone Encounter (Signed)
Refill if appropriate.  Please advise. Confirmed this dose at patient's appointment.

## 2021-11-04 ENCOUNTER — Encounter: Payer: Medicaid Other | Admitting: Family Medicine

## 2021-11-17 ENCOUNTER — Ambulatory Visit (INDEPENDENT_AMBULATORY_CARE_PROVIDER_SITE_OTHER): Payer: Medicaid Other | Admitting: Family Medicine

## 2021-11-17 ENCOUNTER — Encounter: Payer: Self-pay | Admitting: Family Medicine

## 2021-11-17 VITALS — BP 118/78 | HR 60 | Ht 71.5 in | Wt 235.0 lb

## 2021-11-17 DIAGNOSIS — Z0000 Encounter for general adult medical examination without abnormal findings: Secondary | ICD-10-CM

## 2021-11-17 DIAGNOSIS — F418 Other specified anxiety disorders: Secondary | ICD-10-CM

## 2021-11-17 DIAGNOSIS — Z9989 Dependence on other enabling machines and devices: Secondary | ICD-10-CM

## 2021-11-17 DIAGNOSIS — R7989 Other specified abnormal findings of blood chemistry: Secondary | ICD-10-CM

## 2021-11-17 DIAGNOSIS — M2141 Flat foot [pes planus] (acquired), right foot: Secondary | ICD-10-CM

## 2021-11-17 DIAGNOSIS — B009 Herpesviral infection, unspecified: Secondary | ICD-10-CM

## 2021-11-17 DIAGNOSIS — Z1159 Encounter for screening for other viral diseases: Secondary | ICD-10-CM

## 2021-11-17 DIAGNOSIS — G4733 Obstructive sleep apnea (adult) (pediatric): Secondary | ICD-10-CM

## 2021-11-17 DIAGNOSIS — M2140 Flat foot [pes planus] (acquired), unspecified foot: Secondary | ICD-10-CM | POA: Insufficient documentation

## 2021-11-17 DIAGNOSIS — M2142 Flat foot [pes planus] (acquired), left foot: Secondary | ICD-10-CM

## 2021-11-17 DIAGNOSIS — Z114 Encounter for screening for human immunodeficiency virus [HIV]: Secondary | ICD-10-CM | POA: Diagnosis not present

## 2021-11-17 DIAGNOSIS — Z13220 Encounter for screening for lipoid disorders: Secondary | ICD-10-CM

## 2021-11-17 NOTE — Patient Instructions (Signed)
-   Obtain fasting labs with orders provided (can have water or black coffee but otherwise no food or drink x 8 hours before labs) °- Review information provided °- Attend eye doctor annually, dentist every 6 months, work towards or maintain 30 minutes of moderate intensity physical activity at least 5 days per week, and consume a balanced diet °- Return in 1 year for physical °- Contact us for any questions between now and then °

## 2021-11-17 NOTE — Progress Notes (Signed)
?  ? ?Annual Physical Exam Visit ? ?Patient Information:  ?Patient ID: Jason Peterson, male DOB: [birth year] Age: 31 y.o. MRN: [id number]  ? ?Subjective:  ? ?CC: Annual Physical Exam ? ?HPI:  ?Jason Peterson is here for their annual physical. ? ?I reviewed the past medical history, family history, social history, surgical history, and allergies today and changes were made as necessary.  Please see the problem list section below for additional details. ? ?Past Medical History: ?Past Medical History:  ?Diagnosis Date  ? HSV-2 (herpes simplex virus 2) infection   ? ?Past Surgical History: ?History reviewed. No pertinent surgical history. ?Family History: ?Family History  ?Problem Relation Age of Onset  ? Hypertension Maternal Grandmother   ? ?Allergies: ?No Known Allergies ?Health Maintenance: ?Health Maintenance  ?Topic Date Due  ? Hepatitis C Screening  Never done  ? TETANUS/TDAP  11/18/2022 (Originally 12/06/2008)  ? INFLUENZA VACCINE  03/07/2022  ? HIV Screening  Completed  ? HPV VACCINES  Aged Out  ? COVID-19 Vaccine  Discontinued  ?  ?HM Colonoscopy   ? ? This patient has no relevant Health Maintenance data.  ? ?  ? ?Medications: ?Current Outpatient Medications on File Prior to Visit  ?Medication Sig Dispense Refill  ? valACYclovir (VALTREX) 1000 MG tablet Take 1 tablet (1,000 mg total) by mouth daily. 30 tablet 6  ? ?No current facility-administered medications on file prior to visit.  ? ? ?Review of Systems: No headache, visual changes, nausea, vomiting, diarrhea, constipation, dizziness, abdominal pain, skin rash, fevers, chills, night sweats, swollen lymph nodes, weight loss, chest pain, body aches, joint swelling, muscle aches, shortness of breath, mood changes, visual or auditory hallucinations reported. ? ?Objective:  ? ?Vitals:  ? 11/17/21 0940  ?BP: 118/78  ?Pulse: 60  ?SpO2: 98%  ? ?Vitals:  ? 11/17/21 0940  ?Weight: 235 lb (106.6 kg)  ?Height: 5' 11.5" (1.816 m)  ? ?Body mass index is 32.32 kg/m?. ? ?General:  Well Developed, well nourished, and in no acute distress.  ?Neuro: Alert and oriented x3, extra-ocular muscles intact, sensation grossly intact. Cranial nerves II through XII are grossly intact, motor, sensory, and coordinative functions are intact. ?HEENT: Normocephalic, atraumatic, pupils equal round reactive to light, neck supple, no masses, no lymphadenopathy, thyroid nonpalpable. Oropharynx, nasopharynx, external ear canals are unremarkable. ?Skin: Warm and dry, no rashes noted.  ?Cardiac: Regular rate and rhythm, no murmurs rubs or gallops. No peripheral edema. Pulses symmetric. ?Respiratory: Clear to auscultation bilaterally. Not using accessory muscles, speaking in full sentences.  ?Abdominal: Soft, nontender, nondistended, positive bowel sounds, no masses, no organomegaly. ?Musculoskeletal: Shoulder, elbow, wrist, hip, knee, ankle stable, and with full range of motion.  Bilateral pes planus noted. ? ?Impression and Recommendations:  ? ?The patient was counselled, risk factors were discussed, and anticipatory guidance given. ? ?OSA on CPAP ?Did not have an opportunity to establish with ENT/sleep medicine due to phone number change.  New referral was placed, will follow peripherally. ? ?HSV-2 (herpes simplex virus 2) infection ?Chronic issue, asymptomatic, well controlled on current regimen. ? ?Annual physical exam ?Annual examination completed, risk stratification labs ordered, anticipatory guidance provided.  We will follow labs once resulted.  Patient deferred Tdap today. ? ?Flat foot ?Chronic issue incidentally noted during the physical exam, no treatments to date, he is symptomatic right greater than left.  We did discuss surgical and nonsurgical treatment strategies for this.  At this stage we will start home-based exercises and reach out to further measures. ? ?Depression with  anxiety ?Interval improvement in scores, that being said patient did not show need to start medications, has been exercising  and attributes interval improvement to that.  I did encourage patient to maintain follow-up with psychiatry (new referral placed due to prior referral following through due to phone issues), and to continue with exercise based stress reduction. ? ?Orders & Medications ?Medications: No orders of the defined types were placed in this encounter. ? ?Orders Placed This Encounter  ?Procedures  ? Apo A1 + B + Ratio  ? CBC  ? Comprehensive metabolic panel  ? Hepatitis C antibody  ? VITAMIN D 25 Hydroxy (Vit-D Deficiency, Fractures)  ? TSH  ? Lipid panel  ? HIV Antibody (routine testing w rflx)  ? Ambulatory referral to Psychiatry  ? Ambulatory referral to ENT  ?  ? ?Return in about 1 year (around 11/18/2022).  ? ? ?Montel Culver, MD ? ? Primary Care Sports Medicine ?[facility] ?[place]  ? ?

## 2021-11-17 NOTE — Assessment & Plan Note (Signed)
Chronic issue incidentally noted during the physical exam, no treatments to date, he is symptomatic right greater than left.  We did discuss surgical and nonsurgical treatment strategies for this.  At this stage we will start home-based exercises and reach out to further measures. ?

## 2021-11-17 NOTE — Assessment & Plan Note (Signed)
Did not have an opportunity to establish with ENT/sleep medicine due to phone number change.  New referral was placed, will follow peripherally. ?

## 2021-11-17 NOTE — Assessment & Plan Note (Signed)
Chronic issue, asymptomatic, well controlled on current regimen. ?

## 2021-11-17 NOTE — Assessment & Plan Note (Signed)
Interval improvement in scores, that being said patient did not show need to start medications, has been exercising and attributes interval improvement to that.  I did encourage patient to maintain follow-up with psychiatry (new referral placed due to prior referral following through due to phone issues), and to continue with exercise based stress reduction. ?

## 2021-11-17 NOTE — Assessment & Plan Note (Signed)
Annual examination completed, risk stratification labs ordered, anticipatory guidance provided.  We will follow labs once resulted.  Patient deferred Tdap today. ?

## 2021-12-08 ENCOUNTER — Encounter: Payer: Self-pay | Admitting: Family Medicine

## 2021-12-13 ENCOUNTER — Ambulatory Visit: Payer: Medicaid Other | Admitting: Family Medicine

## 2021-12-14 ENCOUNTER — Ambulatory Visit: Payer: Medicaid Other | Admitting: Family Medicine

## 2021-12-16 ENCOUNTER — Ambulatory Visit: Payer: Medicaid Other | Admitting: Family Medicine

## 2022-01-09 ENCOUNTER — Ambulatory Visit: Payer: Medicaid Other | Admitting: Family Medicine

## 2022-01-13 ENCOUNTER — Encounter: Payer: Self-pay | Admitting: Family Medicine

## 2022-01-13 ENCOUNTER — Ambulatory Visit: Payer: Medicaid Other | Admitting: Family Medicine

## 2022-01-13 VITALS — BP 126/80 | HR 78 | Ht 71.5 in | Wt 231.0 lb

## 2022-01-13 DIAGNOSIS — Z6831 Body mass index (BMI) 31.0-31.9, adult: Secondary | ICD-10-CM | POA: Diagnosis not present

## 2022-01-13 DIAGNOSIS — M2142 Flat foot [pes planus] (acquired), left foot: Secondary | ICD-10-CM

## 2022-01-13 DIAGNOSIS — Z9989 Dependence on other enabling machines and devices: Secondary | ICD-10-CM

## 2022-01-13 DIAGNOSIS — M2141 Flat foot [pes planus] (acquired), right foot: Secondary | ICD-10-CM

## 2022-01-13 DIAGNOSIS — G4733 Obstructive sleep apnea (adult) (pediatric): Secondary | ICD-10-CM | POA: Diagnosis not present

## 2022-01-13 MED ORDER — DICLOFENAC SODIUM 50 MG PO TBEC: 50.0000 mg | DELAYED_RELEASE_TABLET | Freq: Two times a day (BID) | ORAL | 0 refills | Status: DC | PRN

## 2022-01-13 NOTE — Patient Instructions (Signed)
-   Referral coordinator will contact you to schedule visit with Dr. Jordan Likes for orthotics evaluation - Can use diclofenac twice daily on as-needed basis to control symptoms between now and then - Recommend continuing home exercises - Start CPAP once you obtain the new mask - Obtain fasting blood work with orders provided today - Our office will contact with results and next steps - Contact us for any questions

## 2022-01-13 NOTE — Progress Notes (Signed)
     Primary Care / Sports Medicine Office Visit  Patient Information:  Patient ID: Jason Peterson, male DOB: 1991/03/26 Age: 31 y.o. MRN: [id number]   Jason Peterson is a pleasant 31 y.o. male presenting with the following:  Chief Complaint  Patient presents with   Foot Pain    Been going on for a few years, been getting worst over the last year. Comes and goes, being active is the worst.     Vitals:   01/13/22 1030  BP: 126/80  Pulse: 78  SpO2: 97%   Vitals:   01/13/22 1030  Weight: 231 lb (104.8 kg)  Height: 5' 11.5" (1.816 m)   Body mass index is 31.77 kg/m.  No results found.   Independent interpretation of notes and tests performed by another provider:   None  Procedures performed:   None  Pertinent History, Exam, Impression, and Recommendations:   Problem List Items Addressed This Visit       Respiratory   OSA on CPAP    Patient did have a chance to establish with ENT/sleep medicine, new CPAP device is being coordinated.  I have stressed the importance of regular CPAP usage in regards to his overall health.        Other   Flat foot - Primary    Patient presents for follow-up to chronic bilateral foot pain in the setting of pes planus.  Right greater than left symptomatology, this has disrupted his attempts at increasing his physical activity.  Primarily noted with jogging/running, minimally interferes with walking.  At the last visit on 11/17/2021, he started a home-based rehab program for foot and ankle conditioning, strengthening, and stabilization.  Despite this, continues to note pain.  I did discuss both surgical and nonsurgical treatment strategies with the patient, he is still requested to proceed in a nonsurgical manner, and given his limited response to home exercises, will refer to Dr. Clare Gandy for custom molded orthoses, as needed diclofenac, and will follow peripherally on this issue.      Relevant Medications   diclofenac (VOLTAREN) 50 MG EC  tablet   Other Relevant Orders   Ambulatory referral to Sports Medicine   BMI 31.0-31.9,adult    Patient has been attempting to lose weight via exercise alone.  While I have discussed the role of exercise and weight loss, we discussed the importance of focusing on a regular CPAP usage, dietary lifestyle changes, and pending labs to evaluate for any factors that may detriment his weight loss goals.        Orders & Medications Meds ordered this encounter  Medications   diclofenac (VOLTAREN) 50 MG EC tablet    Sig: Take 1 tablet (50 mg total) by mouth 2 (two) times daily as needed.    Dispense:  60 tablet    Refill:  0   Orders Placed This Encounter  Procedures   Ambulatory referral to Sports Medicine     Return if symptoms worsen or fail to improve.     Jerrol Banana, MD   Primary Care Sports Medicine [facility]

## 2022-01-13 NOTE — Assessment & Plan Note (Signed)
Patient has been attempting to lose weight via exercise alone.  While I have discussed the role of exercise and weight loss, we discussed the importance of focusing on a regular CPAP usage, dietary lifestyle changes, and pending labs to evaluate for any factors that may detriment his weight loss goals.

## 2022-01-13 NOTE — Assessment & Plan Note (Signed)
Patient did have a chance to establish with ENT/sleep medicine, new CPAP device is being coordinated.  I have stressed the importance of regular CPAP usage in regards to his overall health.

## 2022-01-13 NOTE — Assessment & Plan Note (Signed)
Patient presents for follow-up to chronic bilateral foot pain in the setting of pes planus.  Right greater than left symptomatology, this has disrupted his attempts at increasing his physical activity.  Primarily noted with jogging/running, minimally interferes with walking.  At the last visit on 11/17/2021, he started a home-based rehab program for foot and ankle conditioning, strengthening, and stabilization.  Despite this, continues to note pain.  I did discuss both surgical and nonsurgical treatment strategies with the patient, he is still requested to proceed in a nonsurgical manner, and given his limited response to home exercises, will refer to Dr. Clearance Coots for custom molded orthoses, as needed diclofenac, and will follow peripherally on this issue.

## 2022-01-19 ENCOUNTER — Ambulatory Visit (INDEPENDENT_AMBULATORY_CARE_PROVIDER_SITE_OTHER): Payer: Medicaid Other | Admitting: Family Medicine

## 2022-01-19 ENCOUNTER — Encounter: Payer: Self-pay | Admitting: Family Medicine

## 2022-01-19 VITALS — Ht 71.5 in | Wt 231.0 lb

## 2022-01-19 DIAGNOSIS — M2141 Flat foot [pes planus] (acquired), right foot: Secondary | ICD-10-CM | POA: Diagnosis present

## 2022-01-19 DIAGNOSIS — M2142 Flat foot [pes planus] (acquired), left foot: Secondary | ICD-10-CM

## 2022-01-19 NOTE — Assessment & Plan Note (Signed)
Acute on chronic in nature.  Does have significant pes planus. -Counseled on home exercise therapy and supportive care. -Orthotics. -Could consider scaphoid pads.

## 2022-01-19 NOTE — Progress Notes (Signed)
  Jason Peterson - 31 y.o. male MRN [id number]  Date of birth: [birth year]  SUBJECTIVE:  Including CC & ROS.  No chief complaint on file.   Jason Peterson is a 31 y.o. male that is presenting with acute on chronic bilateral foot pain.  The pain is exacerbated with running and jogging.  No injury inciting event.  No history of surgery.  Pain is localized to the plantar aspect of each foot.   Review of Systems See HPI   HISTORY: Past Medical, Surgical, Social, and Family History Reviewed & Updated per EMR.   Pertinent Historical Findings include:  Past Medical History:  Diagnosis Date   HSV-2 (herpes simplex virus 2) infection     History reviewed. No pertinent surgical history.   PHYSICAL EXAM:  VS: Ht 5' 11.5" (1.816 m)   Wt 231 lb (104.8 kg)   BMI 31.77 kg/m  Physical Exam Gen: NAD, alert, cooperative with exam, well-appearing MSK:  Neurovascularly intact    Patient was fitted for a standard, cushioned, semi-rigid orthotic. The orthotic was heated and afterward the patient stood on the orthotic blank positioned on the orthotic stand. The patient was positioned in subtalar neutral position and 10 degrees of ankle dorsiflexion in a weight bearing stance. After completion of molding, a stable base was applied to the orthotic blank. The blank was ground to a stable position for weight bearing. Size: 11 Pairs: 2 Base: Blue EVA Additional Posting and Padding: None The patient ambulated these, and they were very comfortable.     ASSESSMENT & PLAN:   Flat foot Acute on chronic in nature.  Does have significant pes planus. -Counseled on home exercise therapy and supportive care. -Orthotics. -Could consider scaphoid pads.

## 2022-03-09 ENCOUNTER — Encounter: Payer: Self-pay | Admitting: Family Medicine

## 2022-03-09 ENCOUNTER — Ambulatory Visit: Payer: Medicaid Other | Admitting: Family Medicine

## 2022-03-13 ENCOUNTER — Telehealth: Payer: Self-pay | Admitting: Family Medicine

## 2022-03-13 ENCOUNTER — Ambulatory Visit: Payer: Medicaid Other | Admitting: Family Medicine

## 2022-03-13 ENCOUNTER — Encounter: Payer: Self-pay | Admitting: Family Medicine

## 2022-03-13 VITALS — BP 108/82 | HR 72 | Ht 71.5 in | Wt 230.0 lb

## 2022-03-13 DIAGNOSIS — K644 Residual hemorrhoidal skin tags: Secondary | ICD-10-CM | POA: Diagnosis not present

## 2022-03-13 MED ORDER — BISACODYL 5 MG PO TBEC: 5.0000 mg | DELAYED_RELEASE_TABLET | Freq: Every day | ORAL | 0 refills | Status: DC | PRN

## 2022-03-13 MED ORDER — TRIAMCINOLONE ACETONIDE 0.1 % EX OINT: 1.0000 | TOPICAL_OINTMENT | Freq: Two times a day (BID) | CUTANEOUS | 6 refills | Status: AC

## 2022-03-13 MED ORDER — HYDROCORTISONE ACETATE 25 MG RE SUPP: 25.0000 mg | Freq: Two times a day (BID) | RECTAL | 0 refills | Status: DC

## 2022-03-13 NOTE — Telephone Encounter (Signed)
Copied from CRM [id number]. Topic: General - Other >> Mar 13, 2022 11:27 AM Everette C wrote: Reason for CRM: The patient has been notified that their hydrocortisone (ANUSOL-HC) 25 MG suppository [[id number]]  prescription is not covered by their insurance  The patient would like to speak with a member of clinical staff further about previously requested prior authorizations, an alternative prescription and a further conversation about rx coordination   Please contact when possible

## 2022-03-13 NOTE — Assessment & Plan Note (Addendum)
Patient with 3 weeks history of progressively worsening perianal pain, swelling, pruritus.  Did see urgent care this weekend who raised concern for intertrigo, was started on topical antifungal, patient does not note significant improvement.  When asked about bowel habits, states that he has been dealing with severe constipation as of late, denies any dysuria, no perigenital involvement, no fevers, chills, new sexual contacts.  Examination of the perianal area reveals raised undulations raising concern for external hemorrhoids, skin appears intact without significant erythema or breakdown, periscrotal and inguinal regions benign.  Given his reassuring skin findings, evidence of external mild hemorrhoids, and recent constipation, concern for symptoms stemming from external hemorrhoids.  I did discuss the utility of continuation of the topical antifungal however rectal steroid suppository as well as topical steroid cream prescribed.  Heavy focus on bowel regimen discussed including OTC measures.

## 2022-03-13 NOTE — Telephone Encounter (Signed)
Spoke to pt he stated that he called his insurance and they are sending a PA. Waiting on PA.  KP

## 2022-03-13 NOTE — Patient Instructions (Addendum)
-   Obtain fasting labs - Use suppository twice daily until symptoms resolve - Use topical steroid daily until symptoms resolve - Continue antifungal cream - Follow Sitz baths instructions, use witch hazel or epsom salt with the water - Start Miralax daily and increase water intake - If no bowel movement in 3 days, take Dulcolax - Cane take 3-4 ibuprofen 200 mg tabs every 8 hours for pain - Contact for questions

## 2022-03-13 NOTE — Telephone Encounter (Signed)
Told pt to use steroid cream until we see if insurance will approve medication. Pt verbalized understanding.  KP

## 2022-03-13 NOTE — Progress Notes (Signed)
     Primary Care / Sports Medicine Office Visit  Patient Information:  Patient ID: Jason Peterson, male DOB: Aug 18, 1990 Age: 31 y.o. MRN: [id number]   [name] is a pleasant 31 y.o. male presenting with the following:  Chief Complaint  Patient presents with   Rash    X 3 weeks, buttocks area , getting worse, burning, itching, redness, swelling, sexual active, no std concerns, had jock itch 3 years ago it kind of feels the same but worse, raised bumps, used cream doesn't help     Vitals:   03/13/22 0847  BP: 108/82  Pulse: 72  SpO2: 98%   Vitals:   03/13/22 0847  Weight: 230 lb (104.3 kg)  Height: 5' 11.5" (1.816 m)   Body mass index is 31.63 kg/m.  No results found.   Independent interpretation of notes and tests performed by another provider:   None  Procedures performed:   None  Pertinent History, Exam, Impression, and Recommendations:   Problem List Items Addressed This Visit       Cardiovascular and Mediastinum   External hemorrhoids - Primary    Patient with 3 weeks history of progressively worsening perianal pain, swelling, pruritus.  Did see urgent care this weekend who raised concern for intertrigo, was started on topical antifungal, patient does not note significant improvement.  When asked about bowel habits, states that he has been dealing with severe constipation as of late, denies any dysuria, no perigenital involvement, no fevers, chills, new sexual contacts.  Examination of the perianal area reveals raised undulations raising concern for external hemorrhoids, skin appears intact without significant erythema or breakdown, periscrotal and inguinal regions benign.  Given his reassuring skin findings, evidence of external mild hemorrhoids, and recent constipation, concern for symptoms stemming from external hemorrhoids.  I did discuss the utility of continuation of the topical antifungal however rectal steroid suppository as well as topical steroid cream  prescribed.  Heavy focus on bowel regimen discussed including OTC measures.      Relevant Medications   hydrocortisone (ANUSOL-HC) 25 MG suppository   bisacodyl 5 MG EC tablet   triamcinolone ointment (KENALOG) 0.1 %     Orders & Medications Meds ordered this encounter  Medications   hydrocortisone (ANUSOL-HC) 25 MG suppository    Sig: Place 1 suppository (25 mg total) rectally 2 (two) times daily.    Dispense:  24 suppository    Refill:  0   bisacodyl 5 MG EC tablet    Sig: Take 1 tablet (5 mg total) by mouth daily as needed for moderate constipation.    Dispense:  30 tablet    Refill:  0   triamcinolone ointment (KENALOG) 0.1 %    Sig: Apply 1 Application topically 2 (two) times daily. To affected areas    Dispense:  60 g    Refill:  6   No orders of the defined types were placed in this encounter.    Return in about 38 weeks (around 12/04/2022) for Annual Physical.     Jerrol Banana, MD   Primary Care Sports Medicine [facility]

## 2022-03-13 NOTE — Telephone Encounter (Signed)
Please review.  KP

## 2022-03-15 NOTE — Telephone Encounter (Signed)
Called pt left VM to call back.  Just wanted the patient to know that we have still not received PA from his insurance.  KP

## 2022-03-16 ENCOUNTER — Ambulatory Visit: Payer: Self-pay

## 2022-03-16 NOTE — Telephone Encounter (Signed)
    Chief Complaint: Pt. 's insurance is not paying for hydrocortisone suppository. Asking what else he can try. Symptoms: Hemorrhoids  Frequency: 3 weeks ago Pertinent Negatives: Patient denies  Disposition: [] ED /[] Urgent Care (no appt availability in office) / [] Appointment(In office/virtual)/ []  [facility]/ [] Home Care/ [] Refused Recommended Disposition /[] [facility]/ [x]  Follow-up with PCP Additional Notes: Please advise pt.  Answer Assessment - Initial Assessment Questions 1. NAME of MEDICINE: "What medicine(s) are you calling about?"     Hydrocortisone suppository 2. QUESTION: "What is your question?" (e.g., double dose of medicine, side effect)     Insurance is not paying for it. What else can pt. Try. 3. PRESCRIBER: "Who prescribed the medicine?" Reason: if prescribed by specialist, call should be referred to that group.     Dr. 4. SYMPTOMS: "Do you have any symptoms?" If Yes, ask: "What symptoms are you having?"  "How bad are the symptoms (e.g., mild, moderate, severe)     N/a 5. PREGNANCY:  "Is there any chance that you are pregnant?" "When was your last menstrual period?"     N/a  Protocols used: Medication Question Call-A-AH

## 2022-03-16 NOTE — Telephone Encounter (Signed)
Spoke to pt told him that he may need to pay out of pocket. Pt stated the medication was over $100. Told pt to see if he could find a lower price thru goodrx to let us know and we will send the prescription to another pharmacy so he can get it for a lower price. Pt asked about OTC suppository. Told pt if he couldn't get the medication to try the OTC ones. Pt verbalized understanding.  KP

## 2022-08-10 ENCOUNTER — Encounter: Payer: Self-pay | Admitting: Family Medicine

## 2022-08-10 ENCOUNTER — Other Ambulatory Visit
Admission: RE | Admit: 2022-08-10 | Discharge: 2022-08-10 | Disposition: A | Discharge status: Home / Self Care | Payer: Medicaid Other | Attending: Family Medicine | Admitting: Family Medicine

## 2022-08-10 ENCOUNTER — Ambulatory Visit (INDEPENDENT_AMBULATORY_CARE_PROVIDER_SITE_OTHER): Payer: PRIVATE HEALTH INSURANCE | Admitting: Family Medicine

## 2022-08-10 VITALS — BP 120/78 | HR 62 | Ht 71.5 in | Wt 234.0 lb

## 2022-08-10 DIAGNOSIS — Z114 Encounter for screening for human immunodeficiency virus [HIV]: Secondary | ICD-10-CM

## 2022-08-10 DIAGNOSIS — Z6831 Body mass index (BMI) 31.0-31.9, adult: Secondary | ICD-10-CM

## 2022-08-10 DIAGNOSIS — Z1159 Encounter for screening for other viral diseases: Secondary | ICD-10-CM | POA: Insufficient documentation

## 2022-08-10 DIAGNOSIS — B009 Herpesviral infection, unspecified: Secondary | ICD-10-CM | POA: Insufficient documentation

## 2022-08-10 DIAGNOSIS — F418 Other specified anxiety disorders: Secondary | ICD-10-CM

## 2022-08-10 DIAGNOSIS — Z13220 Encounter for screening for lipoid disorders: Secondary | ICD-10-CM | POA: Diagnosis not present

## 2022-08-10 DIAGNOSIS — K644 Residual hemorrhoidal skin tags: Secondary | ICD-10-CM

## 2022-08-10 DIAGNOSIS — G4733 Obstructive sleep apnea (adult) (pediatric): Secondary | ICD-10-CM

## 2022-08-10 LAB — CBC
HCT: 42.1 % (ref 39.0–52.0)
Hemoglobin: 13.9 g/dL (ref 13.0–17.0)
MCH: 27.9 pg (ref 26.0–34.0)
MCHC: 33 g/dL (ref 30.0–36.0)
MCV: 84.5 fL (ref 80.0–100.0)
Platelets: 204 10*3/uL (ref 150–400)
RBC: 4.98 MIL/uL (ref 4.22–5.81)
RDW: 13.9 % (ref 11.5–15.5)
WBC: 9.8 10*3/uL (ref 4.0–10.5)
nRBC: 0 % (ref 0.0–0.2)

## 2022-08-10 LAB — COMPREHENSIVE METABOLIC PANEL
ALT: 22 U/L (ref 0–44)
AST: 24 U/L (ref 15–41)
Albumin: 4.5 g/dL (ref 3.5–5.0)
Alkaline Phosphatase: 81 U/L (ref 38–126)
Anion gap: 8 (ref 5–15)
BUN: 13 mg/dL (ref 6–20)
CO2: 27 mmol/L (ref 22–32)
Calcium: 9.1 mg/dL (ref 8.9–10.3)
Chloride: 100 mmol/L (ref 98–111)
Creatinine, Ser: 0.87 mg/dL (ref 0.61–1.24)
GFR, Estimated: >60 mL/min (ref >60)
Glucose, Bld: 108 mg/dL — ABNORMAL HIGH (ref 70–99)
Potassium: 3.7 mmol/L (ref 3.5–5.1)
Sodium: 135 mmol/L (ref 135–145)
Total Bilirubin: 0.2 mg/dL — ABNORMAL LOW (ref 0.3–1.2)
Total Protein: 8.6 g/dL — ABNORMAL HIGH (ref 6.5–8.1)

## 2022-08-10 LAB — HEPATITIS C ANTIBODY: HCV Ab: NONREACTIVE

## 2022-08-10 LAB — LIPID PANEL
Cholesterol: 234 mg/dL — ABNORMAL HIGH (ref 0–200)
HDL: 45 mg/dL (ref >40)
LDL Cholesterol: 162 mg/dL — ABNORMAL HIGH (ref 0–99)
Total CHOL/HDL Ratio: 5.2 RATIO
Triglycerides: 134 mg/dL (ref <150)
VLDL: 27 mg/dL (ref 0–40)

## 2022-08-10 LAB — HIV ANTIBODY (ROUTINE TESTING W REFLEX): HIV Screen 4th Generation wRfx: NONREACTIVE

## 2022-08-10 LAB — TSH: TSH: 1.417 u[IU]/mL (ref 0.350–4.500)

## 2022-08-10 MED ORDER — HYDROCORTISONE 2.5 % EX CREA: TOPICAL_CREAM | CUTANEOUS | 0 refills | Status: AC

## 2022-08-10 MED ORDER — BISACODYL 5 MG PO TBEC: 5.0000 mg | DELAYED_RELEASE_TABLET | Freq: Every day | ORAL | 0 refills | Status: AC | PRN

## 2022-08-10 NOTE — Patient Instructions (Addendum)
-   Obtain labs today - Utilize topical steroid cream - Review information attached - Follow-up with Dr. Kathyrn Sheriff ENT / Sleep Medicine - Referral coordinator will call to schedule GI visit - Return in 1 month - Contact for questions

## 2022-08-10 NOTE — Assessment & Plan Note (Signed)
Chronic condition with symptom recurrence over the past few days despite increased hydration, relatively stable bowel regimen.  Previous episode resolved with topical steroid 1% usage.  He denies any blood on tissue paper or in toilet bowl, pruritus and pain with sitting, awakes from night.  Has restarted topical 1% steroid without significant response.  Denies any additional constitutional symptoms.  Examination with mild erythematous skin perirectal region without breakdown, at the 5 o'clock position there is palpable tender protrusion consistent with hemorrhoid.  No frank blood per rectum.  Clinical history and findings are most consistent with recurrent hemorrhoids, patient oriented information provided, titration in steroid strength prescribed, referral to GI placed due to recurrent nature for further management if still symptomatic.

## 2022-08-10 NOTE — Assessment & Plan Note (Signed)
Has not had a chance to follow-up with ENT, advised scheduling visit, we will follow peripherally.

## 2022-08-10 NOTE — Assessment & Plan Note (Signed)
Patient brings up low libido, low energy, lack of drive, etc.  Did discuss multifactorial nature of these symptoms, restratification labs have been ordered, will follow accordingly.

## 2022-08-10 NOTE — Progress Notes (Signed)
Primary Care / Sports Medicine Office Visit  Patient Information:  Patient ID: Jason Peterson, male DOB: 06-15-1991 Age: 32 y.o. MRN: [id number]   Jason Peterson is a pleasant 32 y.o. male presenting with the following:  Chief Complaint  Patient presents with   Hemorrhoids    Feels the same as 6 months ago, started using the ointment that was Rxed in August     Vitals:   08/10/22 1616  BP: 120/78  Pulse: 62  SpO2: 99%   Vitals:   08/10/22 1616  Weight: 234 lb (106.1 kg)  Height: 5' 11.5" (1.816 m)   Body mass index is 32.18 kg/m.  No results found.   Independent interpretation of notes and tests performed by another provider:   None  Procedures performed:   None  Pertinent History, Exam, Impression, and Recommendations:   Jason Peterson was seen today for hemorrhoids.  External hemorrhoids Assessment & Plan: Chronic condition with symptom recurrence over the past few days despite increased hydration, relatively stable bowel regimen.  Previous episode resolved with topical steroid 1% usage.  He denies any blood on tissue paper or in toilet bowl, pruritus and pain with sitting, awakes from night.  Has restarted topical 1% steroid without significant response.  Denies any additional constitutional symptoms.  Examination with mild erythematous skin perirectal region without breakdown, at the 5 o'clock position there is palpable tender protrusion consistent with hemorrhoid.  No frank blood per rectum.  Clinical history and findings are most consistent with recurrent hemorrhoids, patient oriented information provided, titration in steroid strength prescribed, referral to GI placed due to recurrent nature for further management if still symptomatic.  Orders: -     Bisacodyl; Take 1 tablet (5 mg total) by mouth daily as needed for moderate constipation.  Dispense: 30 tablet; Refill: 0 -     Hydrocortisone; Apply sparingly as needed up to two times per day, do not use more than 7  days  Dispense: 20 g; Refill: 0  BMI 31.0-31.9,adult -     CBC -     Comprehensive metabolic panel -     Hepatitis C antibody -     Lipid panel -     HIV Antibody (routine testing w rflx) -     TSH  Screening for lipoid disorders -     Comprehensive metabolic panel -     Lipid panel  Screening for HIV (human immunodeficiency virus) -     HIV Antibody (routine testing w rflx)  Need for hepatitis C screening test -     Hepatitis C antibody  Depression with anxiety Overview:    08/10/2022    4:39 PM 03/13/2022    8:57 AM 01/13/2022   10:34 AM 11/17/2021    9:44 AM 09/23/2021    3:40 PM  Depression screen PHQ 2/9  Decreased Interest 0 1 1 1 1   Down, Depressed, Hopeless 0 1 1 1 2   PHQ - 2 Score 0 2 2 2 3   Altered sleeping 0 1 0 1 0  Tired, decreased energy 0 1 1 1 2   Change in appetite 0 1 0 0 1  Feeling bad or failure about yourself  0 1 0 1 2  Trouble concentrating 0 1 0 0 0  Moving slowly or fidgety/restless 0 0 0 0 0  Suicidal thoughts 0 0 0 0 0  PHQ-9 Score 0 7 3 5 8   Difficult doing work/chores Not difficult at all Somewhat difficult Somewhat difficult Somewhat difficult  Somewhat difficult      08/10/2022    4:40 PM 03/13/2022    8:58 AM 01/13/2022   10:34 AM 11/17/2021    9:44 AM  GAD 7 : Generalized Anxiety Score  Nervous, Anxious, on Edge 0 1 1 1   Control/stop worrying 0 1 1 1   Worry too much - different things 0 1 1 1   Trouble relaxing 0 2 1 0  Restless 0 2 0 0  Easily annoyed or irritable 0 0 0 2  Afraid - awful might happen 0 0 0 0  Total GAD 7 Score 0 7 4 5   Anxiety Difficulty Not difficult at all Somewhat difficult Not difficult at all      Assessment & Plan: Patient brings up low libido, low energy, lack of drive, etc.  Did discuss multifactorial nature of these symptoms, restratification labs have been ordered, will follow accordingly.   OSA on CPAP Assessment & Plan: Has not had a chance to follow-up with ENT, advised scheduling visit, we will follow  peripherally.      Orders & Medications Meds ordered this encounter  Medications   bisacodyl 5 MG EC tablet    Sig: Take 1 tablet (5 mg total) by mouth daily as needed for moderate constipation.    Dispense:  30 tablet    Refill:  0   hydrocortisone 2.5 % cream    Sig: Apply sparingly as needed up to two times per day, do not use more than 7 days    Dispense:  20 g    Refill:  0   Orders Placed This Encounter  Procedures   CBC   Comprehensive metabolic panel   Hepatitis C antibody   Lipid panel   HIV Antibody (routine testing w rflx)   TSH     Return in about 4 weeks (around 09/07/2022) for follow-up.     Montel Culver, MD, [facility]   Primary Care Sports Medicine Primary Care and Sports Medicine at [facility]

## 2022-08-11 ENCOUNTER — Ambulatory Visit (INDEPENDENT_AMBULATORY_CARE_PROVIDER_SITE_OTHER): Payer: PRIVATE HEALTH INSURANCE | Admitting: Family Medicine

## 2022-08-11 ENCOUNTER — Encounter: Payer: Self-pay | Admitting: Family Medicine

## 2022-08-11 VITALS — BP 118/80 | HR 88 | Ht 71.0 in | Wt 234.0 lb

## 2022-08-11 DIAGNOSIS — E785 Hyperlipidemia, unspecified: Secondary | ICD-10-CM | POA: Diagnosis not present

## 2022-08-11 DIAGNOSIS — K644 Residual hemorrhoidal skin tags: Secondary | ICD-10-CM | POA: Diagnosis not present

## 2022-08-11 DIAGNOSIS — G4733 Obstructive sleep apnea (adult) (pediatric): Secondary | ICD-10-CM

## 2022-08-11 NOTE — Progress Notes (Signed)
unable to add, he had them done at [facility] not [facility]

## 2022-08-11 NOTE — Patient Instructions (Addendum)
-   Touch base with ENT to get CPAP - Review information on lifestyle / diet changes - Return in April for physical - Reach out for questions  Diet & Exercise Recommendations Dietary changes to include reducing saturated fats, sodium, avoiding red meat, fried, processed foods, full-fat dairy, baked goods, and sweets. Incorporate more fruits, vegetables, fiber-rich foods such as whole grains, and transition to more eggs and lean meats. Make realistic changes where you can and stick to it!  Physical activity should be focused on getting 150 to 300 minutes per week of moderate to vigorous activity (30 minutes of activity at least 5 days of the week at a level of intensity where you can carry on a conversation without being out of breath). However, any increase in activity is beneficial to your health! Physical activity reduces symptoms of depression and anxiety and improves sleep quality (increase the time in deep sleep and reduce daytime sleepiness). Benefits include weight loss, improved insulin sensitivity, less adiposity, and increased bone health. Walking lowers systolic and diastolic blood pressures as well as your resting heart rate.  Cognitive benefits of exercise include short-term improvements in executive function, memory, processing speed, attention, and academic performance Increasing physical activity as we age can help [place] maintain independence by reducing cognitive decline and falls.

## 2022-08-13 DIAGNOSIS — E785 Hyperlipidemia, unspecified: Secondary | ICD-10-CM | POA: Insufficient documentation

## 2022-08-13 NOTE — Assessment & Plan Note (Signed)
Newly diagnosed, lifestyle modifications encouraged.

## 2022-08-13 NOTE — Assessment & Plan Note (Signed)
Reestablishment with ENT/Sleep Medicine encouraged.

## 2022-08-13 NOTE — Assessment & Plan Note (Signed)
Stable, advised use of higher Rx strength recently prescribed.

## 2022-08-13 NOTE — Progress Notes (Signed)
     Primary Care / Sports Medicine Office Visit  Patient Information:  Patient ID: Jason Peterson, male DOB: 07/13/1991 Age: 32 y.o. MRN: [id number]   Jason Peterson is a pleasant 32 y.o. male presenting with the following:  Chief Complaint  Patient presents with   Labs Only    Vitals:   08/11/22 1509  BP: 118/80  Pulse: 88  SpO2: 98%   Vitals:   08/11/22 1509  Weight: 234 lb (106.1 kg)  Height: 5\' 11"  (1.803 m)   Body mass index is 32.64 kg/m.  No results found.   Independent interpretation of notes and tests performed by another provider:   None  Procedures performed:   None  Pertinent History, Exam, Impression, and Recommendations:   Jason Peterson was seen today for labs only.  External hemorrhoids Assessment & Plan: Stable, advised use of higher Rx strength recently prescribed.   OSA on CPAP Assessment & Plan: Reestablishment with ENT/Sleep Medicine encouraged.   Hyperlipidemia, unspecified hyperlipidemia type Assessment & Plan: Newly diagnosed, lifestyle modifications encouraged.      Orders & Medications No orders of the defined types were placed in this encounter.  No orders of the defined types were placed in this encounter.    Return in about 3 months (around 11/10/2022) for CPE.     Jason Culver, MD, [facility]   Primary Care Sports Medicine Primary Care and Sports Medicine at [facility]

## 2022-08-28 ENCOUNTER — Encounter: Payer: Self-pay | Admitting: Family Medicine

## 2022-08-28 NOTE — Telephone Encounter (Signed)
Please review.  KP

## 2022-09-04 ENCOUNTER — Telehealth: Payer: Self-pay | Admitting: Family Medicine

## 2022-09-04 ENCOUNTER — Other Ambulatory Visit: Payer: Self-pay

## 2022-09-04 DIAGNOSIS — K5900 Constipation, unspecified: Secondary | ICD-10-CM

## 2022-09-04 DIAGNOSIS — K644 Residual hemorrhoidal skin tags: Secondary | ICD-10-CM

## 2022-09-04 NOTE — Telephone Encounter (Signed)
Copied from [place] [id number]. Topic: Referral - Status >> Sep 04, 2022 10:14 AM Cyndi Bender wrote: Reason for CRM: Pt stated he has not heard from anyone regarding the referral for hemorrhoids. Cb# [phone]

## 2022-09-04 NOTE — Telephone Encounter (Signed)
PC to pt, placed referral hopefully hear something soon.

## 2022-09-13 ENCOUNTER — Ambulatory Visit: Payer: PRIVATE HEALTH INSURANCE | Admitting: Family Medicine

## 2022-09-18 NOTE — Telephone Encounter (Signed)
Can you see about sending his referral out to [facility] or [facility]?

## 2022-11-16 DIAGNOSIS — R194 Change in bowel habit: Secondary | ICD-10-CM | POA: Insufficient documentation

## 2022-11-20 ENCOUNTER — Encounter: Payer: Self-pay | Admitting: *Deleted

## 2022-11-21 ENCOUNTER — Encounter: Payer: Medicaid Other | Admitting: Family Medicine

## 2022-11-21 ENCOUNTER — Encounter: Payer: Self-pay | Admitting: Family Medicine

## 2022-12-04 ENCOUNTER — Encounter: Payer: Medicaid Other | Admitting: Family Medicine

## 2022-12-06 ENCOUNTER — Other Ambulatory Visit: Payer: Self-pay

## 2022-12-07 ENCOUNTER — Encounter: Payer: Self-pay | Admitting: Gastroenterology

## 2022-12-07 ENCOUNTER — Ambulatory Visit: Payer: Medicaid Other | Admitting: Gastroenterology

## 2023-11-06 ENCOUNTER — Other Ambulatory Visit: Payer: Self-pay | Admitting: Family Medicine

## 2023-11-06 DIAGNOSIS — B009 Herpesviral infection, unspecified: Secondary | ICD-10-CM

## 2023-11-06 NOTE — Telephone Encounter (Signed)
 Copied from CRM [id number]. Topic: Clinical - Medication Refill >> Nov 06, 2023 10:46 AM Bobbye Morton wrote: Most Recent Primary Care Visit:  Provider: Jerrol Banana  Department: [facility]  Visit Type: OFFICE VISIT  Date: 08/11/2022  Medication: valACYclovir (VALTREX) 1000 MG tablet  Has the patient contacted their pharmacy? Yes (Agent: If no, request that the patient contact the pharmacy for the refill. If patient does not wish to contact the pharmacy document the reason why and proceed with request.) (Agent: If yes, when and what did the pharmacy advise?)  Is this the correct pharmacy for this prescription? No If no, delete pharmacy and type the correct one.  This is the patient's preferred pharmacy:  [facility]  [address] [place], [place], [place] [address] Phone: [phone]  Has the prescription been filled recently? No, last fill 09/30/2021  Is the patient out of the medication? Yes  Has the patient been seen for an appointment in the last year OR does the patient have an upcoming appointment? No, 08/11/2022  Can we respond through MyChart? Yes  Agent: Please be advised that Rx refills may take up to 3 business days. We ask that you follow-up with your pharmacy.

## 2023-11-06 NOTE — Telephone Encounter (Unsigned)
 Copied from CRM [id number]. Topic: Clinical - Medication Refill >> Nov 06, 2023 10:46 AM Bobbye Morton wrote: Most Recent Primary Care Visit:  Provider: Jerrol Banana  Department: [facility]  Visit Type: OFFICE VISIT  Date: 08/11/2022  Medication: valACYclovir (VALTREX) 1000 MG tablet  Has the patient contacted their pharmacy? Yes (Agent: If no, request that the patient contact the pharmacy for the refill. If patient does not wish to contact the pharmacy document the reason why and proceed with request.) (Agent: If yes, when and what did the pharmacy advise?)  Is this the correct pharmacy for this prescription? No If no, delete pharmacy and type the correct one.  This is the patient's preferred pharmacy:  [facility]  [address] [place], [place], [place] [address] Phone: [phone]  Has the prescription been filled recently? No, last fill 09/30/2021  Is the patient out of the medication? Yes  Has the patient been seen for an appointment in the last year OR does the patient have an upcoming appointment? No, 08/11/2022  Can we respond through MyChart? Yes  Agent: Please be advised that Rx refills may take up to 3 business days. We ask that you follow-up with your pharmacy.

## 2023-11-08 NOTE — Telephone Encounter (Signed)
 Requested medication (s) are due for refill today: yes  Requested medication (s) are on the active medication list: yes  Last refill:  09/30/21  Future visit scheduled: no  Notes to clinic:  Unable to refill per protocol, appointment needed.      Requested Prescriptions  Pending Prescriptions Disp Refills   valACYclovir (VALTREX) 1000 MG tablet 30 tablet 6    Sig: Take 1 tablet (1,000 mg total) by mouth daily.     Antimicrobials:  Antiviral Agents - Anti-Herpetic Failed - 11/08/2023 10:23 AM      Failed - Valid encounter within last 12 months    Recent Outpatient Visits   None

## 2023-11-08 NOTE — Telephone Encounter (Signed)
 Requested medications are due for refill today.  yes  Requested medications are on the active medications list.  yes  Last refill. 09/30/2021 #30 6 rf  Future visit scheduled.   no  Notes to clinic.  Pt last seen 08/11/2022. Rx is from 09/2021.    Requested Prescriptions  Pending Prescriptions Disp Refills   valACYclovir (VALTREX) 1000 MG tablet 30 tablet 6    Sig: Take 1 tablet (1,000 mg total) by mouth daily.     Antimicrobials:  Antiviral Agents - Anti-Herpetic Failed - 11/08/2023 10:21 AM      Failed - Valid encounter within last 12 months    Recent Outpatient Visits   None

## 2023-11-23 ENCOUNTER — Ambulatory Visit: Payer: Self-pay | Admitting: Family Medicine

## 2023-11-23 ENCOUNTER — Encounter: Payer: Self-pay | Admitting: Family Medicine
